# Patient Record
Sex: Female | Born: 1995
Health system: Southern US, Community
[De-identification: ages and names within clinical notes are randomized; demographics above are authoritative.]

## PROBLEM LIST (undated history)

## (undated) ENCOUNTER — Ambulatory Visit (HOSPITAL_COMMUNITY): Payer: MEDICAID

## (undated) HISTORY — PX: NO PAST SURGERIES: SHX2092

---
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
## 2016-09-13 DIAGNOSIS — L209 Atopic dermatitis, unspecified: Secondary | ICD-10-CM | POA: Insufficient documentation

## 2019-09-22 ENCOUNTER — Ambulatory Visit: Payer: Self-pay | Admitting: Family Medicine

## 2019-10-08 ENCOUNTER — Ambulatory Visit: Payer: Self-pay | Attending: Internal Medicine | Admitting: Internal Medicine

## 2019-10-08 ENCOUNTER — Encounter: Payer: Self-pay | Admitting: Internal Medicine

## 2019-10-08 ENCOUNTER — Ambulatory Visit: Payer: Self-pay

## 2019-10-08 ENCOUNTER — Other Ambulatory Visit: Payer: Self-pay

## 2019-10-08 VITALS — BP 117/74 | HR 82 | Temp 97.7°F | Resp 16 | Ht 62.0 in | Wt 123.4 lb

## 2019-10-08 DIAGNOSIS — Z113 Encounter for screening for infections with a predominantly sexual mode of transmission: Secondary | ICD-10-CM

## 2019-10-08 DIAGNOSIS — Z124 Encounter for screening for malignant neoplasm of cervix: Secondary | ICD-10-CM

## 2019-10-08 DIAGNOSIS — Z01419 Encounter for gynecological examination (general) (routine) without abnormal findings: Secondary | ICD-10-CM

## 2019-10-08 NOTE — Progress Notes (Signed)
Patient ID: Lori Ponce, female    DOB: April 02, 1996  MRN: [id number]  CC: New Patient (Initial Visit)   Subjective: Lori Ponce is a 24 y.o. female who presents for new pt visit and yearly physical Her concerns today include:   No previous PCP. Previous hx of dep and anxiety, genital herpes. Valcyclovir PRN Wants physical  G1P1. Last pap 2016 No abn Pap smears in the past No vaginal dischg/itching. Wants STD screen 1 same sex partner.   Menses are regular lasting 1 wk. No fhx of breast ovarian or cervical cancer  She feels previous history of depression and anxiety.  She was on several medications in the past but she does not recall the names.  GAD-7 and PHQ-9 screening are positive today.  However she feels that the depression and anxiety are not major issues for her.  No suicidal ideation.  She exercises 5 days a week going to the gym.  She has good family support and support from her partner.  She does not feel she needs any counseling or medication at this time.  Past medical, social, family history and surgical history reviewed and updated. No Known Allergies  Social History   Socioeconomic History  . Marital status: Single    Spouse name: Not on file  . Number of children: 1  . Years of education: 10 th grade  . Highest education level: Not on file  Occupational History  . Not on file  Tobacco Use  . Smoking status: Never Smoker  . Smokeless tobacco: Never Used  Substance and Sexual Activity  . Alcohol use: Yes    Comment: occasionally  . Drug use: Yes    Frequency: 2.0 times per week    Types: Marijuana  . Sexual activity: Yes    Birth control/protection: None  Other Topics Concern  . Not on file  Social History Narrative  . Not on file   Social Determinants of Health   Financial Resource Strain:   . Difficulty of Paying Living Expenses: Not on file  Food Insecurity:   . Worried About Programme researcher, broadcasting/film/video in the Last Year: Not on file  .  Ran Out of Food in the Last Year: Not on file  Transportation Needs:   . Lack of Transportation (Medical): Not on file  . Lack of Transportation (Non-Medical): Not on file  Physical Activity:   . Days of Exercise per Week: Not on file  . Minutes of Exercise per Session: Not on file  Stress:   . Feeling of Stress : Not on file  Social Connections:   . Frequency of Communication with Friends and Family: Not on file  . Frequency of Social Gatherings with Friends and Family: Not on file  . Attends Religious Services: Not on file  . Active Member of Clubs or Organizations: Not on file  . Attends Banker Meetings: Not on file  . Marital Status: Not on file  Intimate Partner Violence:   . Fear of Current or Ex-Partner: Not on file  . Emotionally Abused: Not on file  . Physically Abused: Not on file  . Sexually Abused: Not on file    Family History  Problem Relation Age of Onset  . Diabetes Maternal Grandmother   . Diabetes Maternal Grandfather     Past Surgical History:  Procedure Laterality Date  . NO PAST SURGERIES      ROS: Review of Systems  Constitutional: Negative for activity change, appetite change and fever.  HENT: Negative for dental problem, ear pain, hearing loss, sore throat and voice change.   Eyes: Negative for visual disturbance.       She was prescription lenses.  Last eye exam was about a year ago  Respiratory: Negative for chest tightness and shortness of breath.   Cardiovascular: Negative for chest pain.  Gastrointestinal: Negative for abdominal pain and blood in stool.  Genitourinary: Negative for difficulty urinating and dysuria.  Musculoskeletal: Negative for arthralgias.    PHYSICAL EXAM: BP 117/74   Pulse 82   Temp 97.7 F (36.5 C)   Resp 16   Ht 5\' 2"  (1.575 m)   Wt 123 lb 6.4 oz (56 kg)   LMP 09/13/2019   SpO2 98%   BMI 22.57 kg/m   Physical Exam Constitutional: Appears well-developed and well-nourished. No distress. Head:  Normocephalic. Atraumatic Ears: External right and left ear normal.   Eyes: Conjunctivae and EOM are normal. PERRLA, no scleral icterus.  Mouth: no oral lesions, good oral hygiene, throat clear without exudates Neck: Neck supple.  No tracheal deviation. No thyromegaly. No cervical LN CVS: RRR, S1/S2 +, no murmurs, no gallops, no carotid bruit. No JVD Pulmonary: Effort and breath sounds normal, no stridor, rhonchi, wheezes, rales.  Breasts: Exam done with CMA Pollock present.  No axillary lymphadenopathy.  Breasts are without palpable masses.  No discharge expressed from the nipples Abdominal: Soft. BS +,  no distension, tenderness, rebound or guarding. No organomegaly or masses Pelvic exam: CMA Pollock present -no external vaginal lesion.  Small amount of whitish discharge around the cervix.  Cervix appears grossly normal.  No cervical motion tenderness or adnexal masses.  Uterus felt normal in size. Musculoskeletal: Normal range of motion. No edema and no tenderness.  Neuro: Alert and oriented x3.  Cns grossly intact, Power: 5/5 BL in all 4s.  Reflexes normal.  Normal  muscle tone, coordination. . Skin: Skin is warm and dry. No rash noted. Not diaphoretic. No erythema. No pallor.  Psychiatric: Normal mood and affect. Behavior, judgment, thought content normal.    ASSESSMENT AND PLAN: 1. Well woman exam -Commended her on regular exercise and encouraged her to continue. Discussed and encourage healthy eating habits. Encourage wearing seatbelts when in the moving vehicle, using sunscreen when exposed to sun for more than 10 to 15 minutes -Patient declines flu vaccine and Tdap.  2. Pap smear for cervical cancer screening - Cytology - PAP  3. Screen for STD (sexually transmitted disease) - Cervicovaginal ancillary only - HIV Antibody (routine testing w rflx) - RPR   Patient was given the opportunity to ask questions.  Patient verbalized understanding of the plan and was able to repeat key  elements of the plan.   Orders Placed This Encounter  Procedures  . HIV Antibody (routine testing w rflx)  . RPR     Requested Prescriptions    No prescriptions requested or ordered in this encounter    Return if symptoms worsen or fail to improve.  Karle Plumber, MD, FACP

## 2019-10-08 NOTE — Patient Instructions (Signed)
Keeping You Healthy  Get These Tests 1. Blood Pressure- Have your blood pressure checked once a year by your health care provider.  Normal blood pressure is 120/80. 2. Weight- Have your body mass index (BMI) calculated to screen for obesity.  BMI is measure of body fat based on height and weight.  You can also calculate your own BMI at https://www.west-esparza.com/. 3. Cholesterol- Have your cholesterol checked every 5 years starting at age 25 then yearly starting at age 37. 4. Chlamydia, HIV, and other sexually transmitted diseases- Get screened every year until age 18, then within three months of each new sexual provider. 5. Pap Test - Every 1-5 years; discuss with your health care provider. 6. Mammogram- Every 1-2 years starting at age 25--50  Get these Immunizations  Tetanus shot- Every 10 years.  Flu shot-Every year.  Take these steps 1. Do not smoke-Your healthcare provider can help you quit.  For tips on how to quit go to www.smokefree.gov or call 1-800 QUITNOW. 2. Be physically active- Exercise 5 days a week for at least 30 minutes.  If you are not already physically active, start slow and gradually work up to 30 minutes of moderate physical activity.  Examples of moderate activity include walking briskly, dancing, swimming, bicycling, etc. 3. Breast Cancer- A self breast exam every month is important for early detection of breast cancer.  For more information and instruction on self breast exams, ask your healthcare provider or [place]. 4. Eat a healthy diet- Eat a variety of healthy foods such as fruits, vegetables, whole grains, low fat milk, low fat cheeses, yogurt, lean meats, poultry and fish, beans, nuts, tofu, etc.  For more information go to www. Thenutritionsource.org 5. Drink alcohol in moderation- Limit alcohol intake to one drink or less per day. Never drink and drive. 6. Depression- Your emotional health is as important as your physical  health.  If you're feeling down or losing interest in things you normally enjoy please talk to your healthcare provider about being screened for depression. 7. Dental visit- Brush and floss your teeth twice daily; visit your dentist twice a year. 8. Eye doctor- Get an eye exam at least every 2 years. 9. Helmet use- Always wear a helmet when riding a bicycle, motorcycle, rollerblading or skateboarding. 10. Safe sex- If you may be exposed to sexually transmitted infections, use a condom. 11. Seat belts- Seat belts can save your live; always wear one. 12. Smoke/Carbon Monoxide detectors- These detectors need to be installed on the appropriate level of your home. Replace batteries at least once a year. 13. Skin cancer- When out in the sun please cover up and use sunscreen 15 SPF or higher. 14. Violence- If anyone is threatening or hurting you, please tell your healthcare provider.

## 2019-10-09 LAB — RPR: RPR Ser Ql: NONREACTIVE

## 2019-10-09 LAB — HIV ANTIBODY (ROUTINE TESTING W REFLEX): HIV Screen 4th Generation wRfx: NONREACTIVE

## 2019-10-11 LAB — CYTOLOGY - PAP: Diagnosis: NEGATIVE

## 2019-10-11 LAB — CERVICOVAGINAL ANCILLARY ONLY
Bacterial Vaginitis (gardnerella): POSITIVE — AB
Candida Glabrata: NEGATIVE
Candida Vaginitis: NEGATIVE
Chlamydia: NEGATIVE
Comment: NEGATIVE
Comment: NEGATIVE
Comment: NEGATIVE
Comment: NEGATIVE
Comment: NEGATIVE
Comment: NORMAL
Neisseria Gonorrhea: NEGATIVE
Trichomonas: NEGATIVE

## 2019-10-12 ENCOUNTER — Telehealth: Payer: Self-pay

## 2019-10-12 NOTE — Telephone Encounter (Signed)
Contacted pt to go over lab/pap results pt is aware and doesn't have any questions or concerns

## 2019-10-13 ENCOUNTER — Other Ambulatory Visit: Payer: Self-pay | Admitting: Internal Medicine

## 2019-10-13 MED ORDER — METRONIDAZOLE 500 MG PO TABS: 500.0000 mg | ORAL_TABLET | Freq: Two times a day (BID) | ORAL | 0 refills | Status: DC

## 2019-10-13 MED FILL — metroNIDAZOLE 500 MG TABS: 500 | 7 days supply | Qty: 14 | Fill #0

## 2019-12-08 ENCOUNTER — Other Ambulatory Visit: Payer: Self-pay

## 2019-12-08 ENCOUNTER — Encounter (HOSPITAL_COMMUNITY): Payer: Self-pay

## 2019-12-08 ENCOUNTER — Ambulatory Visit (HOSPITAL_COMMUNITY)
Admission: EM | Admit: 2019-12-08 | Discharge: 2019-12-08 | Disposition: A | Discharge status: Home / Self Care | Payer: Self-pay | Attending: Emergency Medicine | Admitting: Emergency Medicine

## 2019-12-08 DIAGNOSIS — N368 Other specified disorders of urethra: Secondary | ICD-10-CM

## 2019-12-08 DIAGNOSIS — R42 Dizziness and giddiness: Secondary | ICD-10-CM

## 2019-12-08 NOTE — Discharge Instructions (Addendum)
Patient presented with drinkers present take Follow-up with OB/GYN Take OTC Dramamine for dizziness Return or go to ED for worsening of symptoms.

## 2019-12-08 NOTE — ED Provider Notes (Signed)
[facility]    CSN: [id number] Arrival date & time: 12/08/19  1252      History   Chief Complaint Chief Complaint  Patient presents with  . Abscess  . Dizziness    HPI Lori Ponce is a 24 y.o. female.   Who presented to the urgent care with a complaint of abscess on her labia.  Denies drainage but reports redness.  She states she waxed her private area.  He has not used any medication.  Nothing make her symptoms worse.  Denies chills, fever, nausea, vomiting, diarrhea, chest pain, chest tightness, confusion.  She is also complaining  of dizziness that started today. Reports she went out last night and had couple drinks.  Describes the dizziness as unsteady to walk." States that it is constant with episodes lasting few hours.  Has not tried any medication.   Admits to similar symptoms in the past that self resolved.  Denies fever, chills, nausea, vomiting, hearing changes, tinnitus, ear pain, chest pain, syncope, SOB, weakness, slurred speech, memory or emotional changes, facial drooping/ asymmetry, incoordination, numbness or tingling, abdominal pain, changes in bowel or bladder habits.    The history is provided by the patient. No language interpreter was used.  Abscess   History reviewed. No pertinent past medical history.  There are no problems to display for this patient.   Past Surgical History:  Procedure Laterality Date  . NO PAST SURGERIES      OB History   No obstetric history on file.      Home Medications    Prior to Admission medications   Medication Sig Start Date End Date Taking? Authorizing Provider  metroNIDAZOLE (FLAGYL) 500 MG tablet Take 1 tablet (500 mg total) by mouth 2 (two) times daily. 10/13/19   Ladell Pier, MD    Family History Family History  Problem Relation Age of Onset  . Diabetes Maternal Grandmother   . Diabetes Maternal Grandfather     Social History Social History   Tobacco Use  . Smoking status:  Never Smoker  . Smokeless tobacco: Never Used  Substance Use Topics  . Alcohol use: Yes    Comment: occasionally  . Drug use: Yes    Frequency: 2.0 times per week    Types: Marijuana     Allergies   Patient has no known allergies.   Review of Systems Review of Systems  Constitutional: Negative.   Respiratory: Negative.   Cardiovascular: Negative.   Gastrointestinal: Negative.   Skin: Positive for color change.  Neurological: Positive for dizziness.  All other systems reviewed and are negative.    Physical Exam Triage Vital Signs ED Triage Vitals  Enc Vitals Group     BP 12/08/19 1344 109/65     Pulse Rate 12/08/19 1344 64     Resp 12/08/19 1344 16     Temp 12/08/19 1344 98.2 F (36.8 C)     Temp Source 12/08/19 1344 Oral     SpO2 12/08/19 1344 100 %     Weight 12/08/19 1341 113 lb (51.3 kg)     Height --      Head Circumference --      Peak Flow --      Pain Score 12/08/19 1340 7     Pain Loc --      Pain Edu? --      Excl. in [place]? --    No data found.  Updated Vital Signs BP 109/65 (BP Location: Right Arm)  Pulse 64   Temp 98.2 F (36.8 C) (Oral)   Resp 16   Wt 113 lb (51.3 kg)   LMP 11/19/2019   SpO2 100%   BMI 20.67 kg/m   Visual Acuity Right Eye Distance:   Left Eye Distance:   Bilateral Distance:    Right Eye Near:   Left Eye Near:    Bilateral Near:     Physical Exam Vitals and nursing note reviewed. Exam conducted with a chaperone present.  Constitutional:      General: She is not in acute distress.    Appearance: Normal appearance. She is normal weight. She is not ill-appearing, toxic-appearing or diaphoretic.  Cardiovascular:     Rate and Rhythm: Normal rate and regular rhythm.     Pulses: Normal pulses.     Heart sounds: Normal heart sounds. No murmur. No friction rub. No gallop.   Pulmonary:     Effort: Pulmonary effort is normal. No respiratory distress.     Breath sounds: Normal breath sounds. No stridor. No wheezing,  rhonchi or rales.  Chest:     Chest wall: No tenderness.  Genitourinary:    Exam position: Knee-chest position.     Pubic Area: No rash.      Urethra: Prolapse present.       Comments: Ureteral prolapse present Neurological:     General: No focal deficit present.     Mental Status: She is alert and oriented to person, place, and time.     Cranial Nerves: Cranial nerves are intact.     Sensory: Sensation is intact.     Motor: Motor function is intact.     Coordination: Coordination is intact.     Gait: Gait is intact.     Deep Tendon Reflexes:     Reflex Scores:      Patellar reflexes are 2+ on the right side and 2+ on the left side.     UC Treatments / Results  Labs (all labs ordered are listed, but only abnormal results are displayed) Labs Reviewed - No data to display  EKG   Radiology No results found.  Procedures Procedures (including critical care time)  Medications Ordered in UC Medications - No data to display  Initial Impression / Assessment and Plan / UC Course  I have reviewed the triage vital signs and the nursing notes.  Pertinent labs & imaging results that were available during my care of the patient were reviewed by me and considered in my medical decision making (see chart for details).    Patient is stable for discharge.  EKG showed sinus bradycardia.  Was advised to follow-up with OB/GYN for further evaluation regarding urethral prolapse.  Patient went out last night and had a cup of drink.  Symptoms likely from dehydration.  May take OTC Dramamine for dizziness.  Final Clinical Impressions(s) / UC Diagnoses   Final diagnoses:  Urethral prolapse  Dizziness     Discharge Instructions     Patient presented with drinkers present take Follow-up with OB/GYN Take OTC Dramamine for dizziness Return or go to ED for worsening of symptoms.    ED Prescriptions    None     PDMP not reviewed this encounter.   Durward Parcel,  FNP 12/08/19 1529

## 2019-12-08 NOTE — ED Triage Notes (Signed)
Pt states she got waxed and now she has an abscess in her vaginal area. X 2 days pt states she has been dizzy as well.

## 2020-01-09 ENCOUNTER — Encounter (HOSPITAL_COMMUNITY): Payer: Self-pay | Admitting: Emergency Medicine

## 2020-01-09 ENCOUNTER — Emergency Department (HOSPITAL_COMMUNITY)
Admission: EM | Admit: 2020-01-09 | Discharge: 2020-01-09 | Disposition: A | Discharge status: Home / Self Care | Payer: Medicaid Other | Attending: Emergency Medicine | Admitting: Emergency Medicine

## 2020-01-09 ENCOUNTER — Other Ambulatory Visit: Payer: Self-pay

## 2020-01-09 DIAGNOSIS — Z5321 Procedure and treatment not carried out due to patient leaving prior to being seen by health care provider: Secondary | ICD-10-CM | POA: Insufficient documentation

## 2020-01-09 DIAGNOSIS — K0889 Other specified disorders of teeth and supporting structures: Secondary | ICD-10-CM | POA: Insufficient documentation

## 2020-01-09 NOTE — ED Triage Notes (Signed)
Right upper dental pain for a week, no fever no swollen.

## 2020-01-10 ENCOUNTER — Ambulatory Visit (HOSPITAL_COMMUNITY)
Admission: EM | Admit: 2020-01-10 | Discharge: 2020-01-10 | Disposition: A | Discharge status: Home / Self Care | Payer: Medicaid Other | Attending: Family Medicine | Admitting: Family Medicine

## 2020-01-10 ENCOUNTER — Encounter (HOSPITAL_COMMUNITY): Payer: Self-pay | Admitting: Emergency Medicine

## 2020-01-10 ENCOUNTER — Other Ambulatory Visit: Payer: Self-pay

## 2020-01-10 DIAGNOSIS — K0889 Other specified disorders of teeth and supporting structures: Secondary | ICD-10-CM

## 2020-01-10 DIAGNOSIS — K029 Dental caries, unspecified: Secondary | ICD-10-CM

## 2020-01-10 MED ORDER — KETOROLAC TROMETHAMINE 30 MG/ML IJ SOLN
INTRAMUSCULAR | Status: AC
  Filled 2020-01-10: qty 1

## 2020-01-10 MED ORDER — IBUPROFEN 600 MG PO TABS: 600.0000 mg | ORAL_TABLET | Freq: Three times a day (TID) | ORAL | 0 refills | Status: DC | PRN

## 2020-01-10 MED ORDER — CHLORHEXIDINE GLUCONATE 0.12 % MT SOLN
15.0000 mL | Freq: Two times a day (BID) | OROMUCOSAL | 0 refills | Status: DC
Start: 2020-01-10 — End: 2020-05-15

## 2020-01-10 MED ORDER — KETOROLAC TROMETHAMINE 30 MG/ML IJ SOLN
30.0000 mg | Freq: Once | INTRAMUSCULAR | Status: AC
  Administered 2020-01-10: 30 mg via INTRAMUSCULAR

## 2020-01-10 MED ORDER — PENICILLIN V POTASSIUM 500 MG PO TABS: 500.0000 mg | ORAL_TABLET | Freq: Four times a day (QID) | ORAL | 0 refills | Status: AC

## 2020-01-10 MED ORDER — ACETAMINOPHEN 500 MG PO TABS: 500.0000 mg | ORAL_TABLET | Freq: Four times a day (QID) | ORAL | 0 refills | Status: DC | PRN

## 2020-01-10 NOTE — Discharge Instructions (Addendum)
Treating you for dental infection with penicillin.  Take medication as prescribed. Peridex mouthwash to use twice a day to clean mouth Tylenol and ibuprofen for pain as needed Dental resources given

## 2020-01-10 NOTE — ED Provider Notes (Signed)
[facility]    CSN: [id number] Arrival date & time: 01/10/20  0808      History   Chief Complaint Chief Complaint  Patient presents with  . Dental Pain    HPI Lori Ponce is Ponce 24 y.o. female.   Patient is Ponce 24 year old female presents today for right upper dental pain that has been present, waxing waning for the past week.  Has been taking Aleve without much relief of her pain.  Denies any known dental issues in that area and patient does not currently have Ponce dentist.  No swelling, fever, chills, trismus.  ROS per HPI      History reviewed. No pertinent past medical history.  There are no problems to display for this patient.   Past Surgical History:  Procedure Laterality Date  . NO PAST SURGERIES      OB History   No obstetric history on file.      Home Medications    Prior to Admission medications   Medication Sig Start Date End Date Taking? Authorizing Provider  acetaminophen (TYLENOL) 500 MG tablet Take 1 tablet (500 mg total) by mouth every 6 (six) hours as needed. 01/10/20   Lori Halt A, NP  chlorhexidine (PERIDEX) 0.12 % solution Use as directed 15 mLs in the mouth or throat 2 (two) times daily. 01/10/20   Lori Halt A, NP  ibuprofen (ADVIL) 600 MG tablet Take 1 tablet (600 mg total) by mouth every 8 (eight) hours as needed for moderate pain. 01/10/20   Lori Halt A, NP  penicillin v potassium (VEETID) 500 MG tablet Take 1 tablet (500 mg total) by mouth 4 (four) times daily for 7 days. 01/10/20 01/17/20  Lori July, NP    Family History Family History  Problem Relation Age of Onset  . Diabetes Maternal Grandmother   . Diabetes Maternal Grandfather     Social History Social History   Tobacco Use  . Smoking status: Never Smoker  . Smokeless tobacco: Never Used  Substance Use Topics  . Alcohol use: Yes    Comment: occasionally  . Drug use: Yes    Frequency: 2.0 times per week    Types: Marijuana     Allergies   Patient  has no known allergies.   Review of Systems Review of Systems   Physical Exam Triage Vital Signs ED Triage Vitals  Enc Vitals Group     BP 01/10/20 0842 120/73     Pulse Rate 01/10/20 0842 64     Resp 01/10/20 0842 16     Temp 01/10/20 0842 98.1 F (36.7 C)     Temp Source 01/10/20 0842 Oral     SpO2 01/10/20 0842 100 %     Weight --      Height --      Head Circumference --      Peak Flow --      Pain Score 01/10/20 0849 10     Pain Loc --      Pain Edu? --      Excl. in [place]? --    No data found.  Updated Vital Signs BP 120/73 (BP Location: Left Arm)   Pulse 64   Temp 98.1 F (36.7 C) (Oral)   Resp 16   LMP 12/27/2019   SpO2 100%   Visual Acuity Right Eye Distance:   Left Eye Distance:   Bilateral Distance:    Right Eye Near:   Left Eye Near:    Bilateral  Near:     Physical Exam Vitals and nursing note reviewed.  Constitutional:      General: She is not in acute distress.    Appearance: Normal appearance. She is not ill-appearing, toxic-appearing or diaphoretic.  HENT:     Head: Normocephalic.     Nose: Nose normal.     Mouth/Throat:      Comments: Small hole in center of tooth No facial swelling or trismus Eyes:     Conjunctiva/sclera: Conjunctivae normal.  Pulmonary:     Effort: Pulmonary effort is normal.  Musculoskeletal:        General: Normal range of motion.     Cervical back: Normal range of motion.  Skin:    General: Skin is warm and dry.     Findings: No rash.  Neurological:     Mental Status: She is alert.  Psychiatric:        Mood and Affect: Mood normal.      UC Treatments / Results  Labs (all labs ordered are listed, but only abnormal results are displayed) Labs Reviewed - No data to display  EKG   Radiology No results found.  Procedures Procedures (including critical care time)  Medications Ordered in UC Medications  ketorolac (TORADOL) 30 MG/ML injection 30 mg (30 mg Intramuscular Given 01/10/20 0922)     Initial Impression / Assessment and Plan / UC Course  I have reviewed the triage vital signs and the nursing notes.  Pertinent labs & imaging results that were available during my care of the patient were reviewed by me and considered in my medical decision making (see chart for details).     Dental pain  Most likely dental caries causing hole  in tooth Most likely food is getting stuck in the hole and causing inflammation and could be underlying infection. We will give her mouthwash to clean the mouth out twice Ponce day after eating and prescribed penicillin for antibiotic coverage. Ibuprofen and Tylenol for pain Toradol given here for pain Dental resources given Final Clinical Impressions(s) / UC Diagnoses   Final diagnoses:  Pain, dental  Pain due to dental caries     Discharge Instructions     Treating you for dental infection with penicillin.  Take medication as prescribed. Peridex mouthwash to use twice Ponce day to clean mouth Tylenol and ibuprofen for pain as needed Dental resources given     ED Prescriptions    Medication Sig Dispense Auth. Provider   penicillin v potassium (VEETID) 500 MG tablet Take 1 tablet (500 mg total) by mouth 4 (four) times daily for 7 days. 40 tablet Lori Rendall A, NP   ibuprofen (ADVIL) 600 MG tablet Take 1 tablet (600 mg total) by mouth every 8 (eight) hours as needed for moderate pain. 30 tablet Lori Tabares A, NP   acetaminophen (TYLENOL) 500 MG tablet Take 1 tablet (500 mg total) by mouth every 6 (six) hours as needed. 30 tablet Lori Jares A, NP   chlorhexidine (PERIDEX) 0.12 % solution Use as directed 15 mLs in the mouth or throat 2 (two) times daily. 120 mL Lori Costanzo A, NP     PDMP not reviewed this encounter.   Lori Aris, NP 01/10/20 1154

## 2020-01-10 NOTE — ED Triage Notes (Signed)
Right, top tooth ache for one week. Patient does not have a Education officer, community.

## 2020-05-04 DIAGNOSIS — Z9189 Other specified personal risk factors, not elsewhere classified: Secondary | ICD-10-CM | POA: Insufficient documentation

## 2020-05-04 DIAGNOSIS — R4589 Other symptoms and signs involving emotional state: Secondary | ICD-10-CM | POA: Insufficient documentation

## 2020-05-04 DIAGNOSIS — IMO0002 Reserved for concepts with insufficient information to code with codable children: Secondary | ICD-10-CM | POA: Insufficient documentation

## 2020-05-04 HISTORY — DX: Other specified personal risk factors, not elsewhere classified: Z91.89

## 2020-05-04 HISTORY — DX: Other symptoms and signs involving emotional state: R45.89

## 2020-05-10 ENCOUNTER — Telehealth: Payer: Self-pay | Admitting: Licensed Clinical Social Worker

## 2020-05-10 NOTE — Telephone Encounter (Signed)
Call placed to patient. LCSW introduced self and explained role at [facility]. Pt reported that she is interested in establishing psychotherapy and medication management in the community. LCSW informed patient of [facility] [place] and encouraged her to call to schedule an appointment to establish care. Pt verbalized understanding.   LCSW completed Legal Aid referral to assist with medicaid appeal, per pt request. No additional concerns noted. Pt was reminded of upcoming appointment with Dr. Laural Benes scheduled for Monday, Oct 4, 21

## 2020-05-11 ENCOUNTER — Telehealth: Payer: Self-pay | Admitting: Licensed Clinical Social Worker

## 2020-05-11 NOTE — Telephone Encounter (Signed)
LCSW submitted completed Legal Aid referral 

## 2020-05-15 ENCOUNTER — Ambulatory Visit: Payer: Self-pay | Attending: Internal Medicine | Admitting: Licensed Clinical Social Worker

## 2020-05-15 ENCOUNTER — Other Ambulatory Visit: Payer: Self-pay | Admitting: Internal Medicine

## 2020-05-15 ENCOUNTER — Encounter: Payer: Self-pay | Admitting: Internal Medicine

## 2020-05-15 ENCOUNTER — Ambulatory Visit: Payer: Self-pay | Attending: Internal Medicine | Admitting: Internal Medicine

## 2020-05-15 ENCOUNTER — Other Ambulatory Visit: Payer: Self-pay

## 2020-05-15 VITALS — BP 107/69 | HR 75 | Resp 16 | Wt 111.0 lb

## 2020-05-15 DIAGNOSIS — F331 Major depressive disorder, recurrent, moderate: Secondary | ICD-10-CM

## 2020-05-15 DIAGNOSIS — F4310 Post-traumatic stress disorder, unspecified: Secondary | ICD-10-CM | POA: Insufficient documentation

## 2020-05-15 DIAGNOSIS — F419 Anxiety disorder, unspecified: Secondary | ICD-10-CM

## 2020-05-15 DIAGNOSIS — F411 Generalized anxiety disorder: Secondary | ICD-10-CM

## 2020-05-15 DIAGNOSIS — G4489 Other headache syndrome: Secondary | ICD-10-CM

## 2020-05-15 DIAGNOSIS — Z2821 Immunization not carried out because of patient refusal: Secondary | ICD-10-CM | POA: Insufficient documentation

## 2020-05-15 MED ORDER — HYDROXYZINE HCL 25 MG PO TABS: 25.0000 mg | ORAL_TABLET | Freq: Every day | ORAL | 1 refills | Status: DC

## 2020-05-15 MED ORDER — ESCITALOPRAM OXALATE 20 MG PO TABS: 20.0000 mg | ORAL_TABLET | Freq: Every day | ORAL | 0 refills | Status: DC

## 2020-05-15 MED FILL — ESCITALOPRAM 20 MG TABLET: 20 | 30 days supply | Qty: 30 | Fill #0

## 2020-05-15 MED FILL — hydrOXYzine HCL 25 MG TABS: 25 | 30 days supply | Qty: 30 | Fill #0

## 2020-05-15 NOTE — Patient Instructions (Signed)
Stop trazodone.  Start hydroxyzine 25 mg at bedtime instead.  This will help with sleep and anxiety. Decrease Lexapro to 10 mg daily for the next 2 weeks then increase it to 20 mg daily. If you have worsening depression or anxiety.  Please call us or be seen in the emergency room.

## 2020-05-15 NOTE — Progress Notes (Signed)
Patient ID: Lori Ponce, female    DOB: 1995-09-12  MRN: [id number]  CC: Hospitalization Follow-up   Subjective: Lori Ponce is a 24 y.o. female who presents for hospital follow-up.   Her concerns today include:  Patient with history of depression/anxiety, genital herpes  Patient hospitalized on psychiatric ward at [facility] in [facility] from 9/23-20 02/2020 with severe recurrent depression without psychotic features.  Patient was threatening suicide with intentions of cutting her wrist.  She had reported drinking heavily 3 months ago but not recently.  She was started on Lexapro 20 mg, trazodone 50 mg and was told to establish care with behavioral health specialist. She has spoken with our LCSW already and will be meeting with her today.  Today: Pt feels depression/SI getting better. No further SI thoughts.  Lives with her brother. Still very anxious Has appt with [facility] 06/07/20 and counselling starts in November. "I get headaches a lot since she started taking the medications."  Hx of migraines but has not had one in a long time and current headaches are different in character from her migraines..  Migraines usually frontal but current headaches are all over the head. Takes Ibuprofen or Tylenol with relief but HA comes again the following day. No N/V, occasionaly light sensitivity.    She vapes nicotine products.   No street drug use.  No ETOH use.   Work at [employer]   Current Outpatient Medications on File Prior to Visit  Medication Sig Dispense Refill  . escitalopram (LEXAPRO) 20 MG tablet Take by mouth.    . traZODone (DESYREL) 50 MG tablet Take by mouth.    Marland Kitchen acetaminophen (TYLENOL) 500 MG tablet Take 1 tablet (500 mg total) by mouth every 6 (six) hours as needed. (Patient not taking: Reported on 05/15/2020) 30 tablet 0  . chlorhexidine (PERIDEX) 0.12 % solution Use as directed 15 mLs in the mouth or throat 2 (two) times daily. (Patient not taking: Reported  on 05/15/2020) 120 mL 0  . ibuprofen (ADVIL) 600 MG tablet Take 1 tablet (600 mg total) by mouth every 8 (eight) hours as needed for moderate pain. (Patient not taking: Reported on 05/15/2020) 30 tablet 0   No current facility-administered medications on file prior to visit.    No Known Allergies  Social History   Socioeconomic History  . Marital status: Single    Spouse name: Not on file  . Number of children: 1  . Years of education: 10 th grade  . Highest education level: Not on file  Occupational History  . Not on file  Tobacco Use  . Smoking status: Never Smoker  . Smokeless tobacco: Never Used  Vaping Use  . Vaping Use: Never used  Substance and Sexual Activity  . Alcohol use: Yes    Comment: occasionally  . Drug use: Yes    Frequency: 2.0 times per week    Types: Marijuana  . Sexual activity: Yes    Birth control/protection: None  Other Topics Concern  . Not on file  Social History Narrative  . Not on file   Social Determinants of Health   Financial Resource Strain:   . Difficulty of Paying Living Expenses: Not on file  Food Insecurity:   . Worried About Charity fundraiser in the Last Year: Not on file  . Ran Out of Food in the Last Year: Not on file  Transportation Needs:   . Lack of Transportation (Medical): Not on file  .  Lack of Transportation (Non-Medical): Not on file  Physical Activity:   . Days of Exercise per Week: Not on file  . Minutes of Exercise per Session: Not on file  Stress:   . Feeling of Stress : Not on file  Social Connections:   . Frequency of Communication with Friends and Family: Not on file  . Frequency of Social Gatherings with Friends and Family: Not on file  . Attends Religious Services: Not on file  . Active Member of Clubs or Organizations: Not on file  . Attends Archivist Meetings: Not on file  . Marital Status: Not on file  Intimate Partner Violence:   . Fear of Current or Ex-Partner: Not on file  .  Emotionally Abused: Not on file  . Physically Abused: Not on file  . Sexually Abused: Not on file    Family History  Problem Relation Age of Onset  . Diabetes Maternal Grandmother   . Diabetes Maternal Grandfather     Past Surgical History:  Procedure Laterality Date  . NO PAST SURGERIES      ROS: Review of Systems Negative except as stated above  PHYSICAL EXAM: BP 107/69   Pulse 75   Resp 16   Wt 111 lb (50.3 kg)   SpO2 99%   BMI 20.30 kg/m   Wt Readings from Last 3 Encounters:  05/15/20 111 lb (50.3 kg)  01/09/20 113 lb (51.3 kg)  12/08/19 113 lb (51.3 kg)    Physical Exam  General appearance - alert, well appearing, young female and in no distress Mental status - normal mood, behavior, speech, dress, motor activity, and thought processes Neck - supple, no significant adenopathy.  She makes good eye contact. Chest - clear to auscultation, no wheezes, rales or rhonchi, symmetric air entry Heart - normal rate, regular rhythm, normal S1, S2, no murmurs, rubs, clicks or gallops Neurological - cranial nerves II through XII intact, motor and sensory grossly normal bilaterally, Romberg sign negative, normal gait and station Extremities - peripheral pulses normal, no pedal edema, no clubbing or cyanosis  Depression screen [facility] 2/9 05/15/2020 10/08/2019  Decreased Interest 2 2  Down, Depressed, Hopeless 2 1  PHQ - 2 Score 4 3  Altered sleeping 2 0  Tired, decreased energy 2 1  Change in appetite 3 3  Feeling bad or failure about yourself  2 1  Trouble concentrating 0 0  Moving slowly or fidgety/restless 0 0  Suicidal thoughts 2 0  PHQ-9 Score 15 8   GAD 7 : Generalized Anxiety Score 05/15/2020 10/08/2019  Nervous, Anxious, on Edge 3 2  Control/stop worrying 3 2  Worry too much - different things 3 1  Trouble relaxing 3 0  Restless 3 0  Easily annoyed or irritable 0 3  Afraid - awful might happen 0 3  Total GAD 7 Score 15 11    ASSESSMENT AND PLAN:  1.  Recurrent moderate major depressive disorder with anxiety [facility]) Patient reportedly is doing better since hospitalization. I suspect that her headaches may be due to trazodone or of the Lexapro as both can cause headaches.  Lexapro was started at 20 mg instead of 10.  She was given a 2-week supply of the 20 mg tablets and she has about 1 week left.  I recommend that she cuts the pill in half and takes 10 mg daily for the next 2 weeks then after that increase it back to 20 mg to see whether the headaches improve. -Also  recommend discontinuing trazodone.  We will use hydroxyzine instead -Keep appointment with behavioral health specialist later this month. Follow-up immediately or call us if she develops any worsening depression or suicidal thoughts. She met with our LCSW today. - escitalopram (LEXAPRO) 20 MG tablet; Take 1 tablet (20 mg total) by mouth daily.  Dispense: 30 tablet; Refill: 0  2. Anxiety disorder, unspecified type See plan above - hydrOXYzine (ATARAX/VISTARIL) 25 MG tablet; Take 1 tablet (25 mg total) by mouth at bedtime.  Dispense: 30 tablet; Refill: 1  3. Headache syndrome See plan above  4. Influenza vaccination declined This was offered and patient declined.  5. COVID-19 vaccination declined Strongly advised that she gets the COVID-19 vaccines to help protect herself in the community from getting Covid.  Informed her that the vaccines are relatively safe and effective.  Patient declined.    Patient was given the opportunity to ask questions.  Patient verbalized understanding of the plan and was able to repeat key elements of the plan.   No orders of the defined types were placed in this encounter.    Requested Prescriptions    No prescriptions requested or ordered in this encounter    No follow-ups on file.  Karle Plumber, MD, FACP

## 2020-06-05 NOTE — BH Specialist Note (Signed)
Integrated Behavioral Health Initial Visit  MRN: [id number] Name: Lori Ponce  Number of Integrated Behavioral Health Clinician visits:: 1/6 Session Start time: 10:00 AM  Session End time: 10:20 AM Total time: 20  Type of Service: Integrated Behavioral Health- Individual Interpretor:No. Interpretor Name and Language: NA   Warm Hand Off Completed.       SUBJECTIVE: Lori Ponce is a 24 y.o. female accompanied by self Patient was referred by Dr. Laural Benes for depression and anxiety. Patient reports the following symptoms/concerns: Pt reports difficulty managing depression and anxiety symptoms triggered by stress. Symptoms include fidgeting, overwhelming feelings of worry regarding finances, decreased appetite, and irritability Duration of problem: Ongoing; Severity of problem: severe  OBJECTIVE: Mood: Anxious and Affect: Appropriate Risk of harm to self or others: No plan to harm self or others Pt was recently admitted to [facility] due to suicidal ideations. Currently, pt scored positive on phq9; however, denies SI/HI. Protective factors identified and crisis intervention resources provided.  LIFE CONTEXT: Family and Social: Pt receives strong support from family and partner.  School/Work: Pt is employed. She is currently uninsured Self-Care: Pt is participating in medication mangaement. She has an appointment with BH scheduled for 06/07/20 with psychiatrist and a therapy appt in Nov 2021 Life Changes: Pt reports ongoing work stress that negatively impacts pt's ability to manage depression and anxiety symptoms  GOALS ADDRESSED: Patient will: 1. Reduce symptoms of: anxiety, depression and stress Pt agreed to continue with medication management to assist in decrease of symptoms 2. Increase knowledge and/or ability of: coping skills Pt agreed to utilize healthy coping skills discussed (going for a drive and running) 3. Demonstrate ability to: Increase adequate support systems for  patient/family Pt agreed to follow through with scheduled appointments with [facility]  INTERVENTIONS: Interventions utilized: Solution-Focused Strategies, Behavioral Activation and Supportive Counseling  Standardized Assessments completed: GAD-7 and PHQ 2&9 with C-SSRS  ASSESSMENT: Patient currently experiencing difficulty managing depression and anxiety symptoms triggered by stress. Pt was recently admitted to [facility] due to suicidal ideations. Currently, pt scored positive on phq9; however, denies SI/HI. Protective factors identified and crisis intervention resources provided.   Patient may benefit from therapy and continued medication management. Healthy coping skills were identified and patient has upcoming appointments with behavioral health.   PLAN: 1. Follow up with behavioral health clinician on : Contact LCSW with any additional behavioral health and/or resource needs 2. Behavioral recommendations: Utilize strategies discussed and continue to comply with medications 3. Referral(s): Integrated Behavioral Health Services (In Clinic) 4. "From scale of 1-10, how likely are you to follow plan?":   Bridgett Larsson, LCSW 06/05/2020 11:36 PM

## 2020-06-07 ENCOUNTER — Other Ambulatory Visit: Payer: Self-pay

## 2020-06-07 ENCOUNTER — Ambulatory Visit (INDEPENDENT_AMBULATORY_CARE_PROVIDER_SITE_OTHER): Payer: No Payment, Other | Admitting: Clinical

## 2020-06-07 DIAGNOSIS — F339 Major depressive disorder, recurrent, unspecified: Secondary | ICD-10-CM | POA: Insufficient documentation

## 2020-06-07 DIAGNOSIS — F332 Major depressive disorder, recurrent severe without psychotic features: Secondary | ICD-10-CM
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
## 2020-06-20 ENCOUNTER — Other Ambulatory Visit (HOSPITAL_COMMUNITY): Payer: Self-pay | Admitting: Psychiatry

## 2020-06-20 ENCOUNTER — Telehealth (INDEPENDENT_AMBULATORY_CARE_PROVIDER_SITE_OTHER): Payer: No Payment, Other | Admitting: Psychiatry

## 2020-06-20 ENCOUNTER — Encounter (HOSPITAL_COMMUNITY): Payer: Self-pay | Admitting: Psychiatry

## 2020-06-20 ENCOUNTER — Other Ambulatory Visit: Payer: Self-pay

## 2020-06-20 DIAGNOSIS — F331 Major depressive disorder, recurrent, moderate: Secondary | ICD-10-CM

## 2020-06-20 DIAGNOSIS — F419 Anxiety disorder, unspecified: Secondary | ICD-10-CM | POA: Diagnosis not present

## 2020-06-20 DIAGNOSIS — F339 Major depressive disorder, recurrent, unspecified: Secondary | ICD-10-CM

## 2020-06-20 MED ORDER — HYDROXYZINE HCL 10 MG PO TABS: 10.0000 mg | ORAL_TABLET | Freq: Three times a day (TID) | ORAL | 1 refills | Status: DC | PRN

## 2020-06-20 MED ORDER — ESCITALOPRAM OXALATE 20 MG PO TABS: 20.0000 mg | ORAL_TABLET | Freq: Every day | ORAL | 0 refills | Status: DC

## 2020-06-20 MED ORDER — HYDROXYZINE HCL 50 MG PO TABS: 50.0000 mg | ORAL_TABLET | Freq: Every day | ORAL | 1 refills | Status: DC

## 2020-06-20 MED FILL — hydrOXYzine HCL 10 MG TABS: 10 | 30 days supply | Qty: 90 | Fill #0

## 2020-06-20 MED FILL — hydrOXYzine HCL 50 MG TABS: 50 | 30 days supply | Qty: 30 | Fill #0

## 2020-06-20 MED FILL — ESCITALOPRAM 20 MG TABLET: 20 | 30 days supply | Qty: 30 | Fill #0

## 2020-06-20 NOTE — Progress Notes (Signed)
Psychiatric Initial Adult Assessment   Virtual Visit via Video Note  I connected with Lori Ponce on 06/20/20 at 11:00 AM EST by a video enabled telemedicine application and verified that I am speaking with the correct person using two identifiers.  Location: Patient: Home Provider: Clinic   I discussed the limitations of evaluation and management by telemedicine and the availability of in person appointments. The patient expressed understanding and agreed to proceed.  I provided 28 minutes of non-face-to-face time during this encounter.     Patient Identification: Lori Ponce MRN:  [id number] Date of Evaluation:  06/20/2020   Referral Source: Dr. Laural Benes  Chief Complaint:   " I still get really bad anxiety."  Visit Diagnosis:    ICD-10-CM   1. Major depressive disorder, recurrent episode with anxious distress (HCC)  F33.9     History of Present Illness: This is a 24 year old female with history of MDD and anxiety now seen for evaluation.  Patient reported that she was recently hospitalized in September for depression and anxiety and was discharged on Lexapro and hydroxyzine.  She stated that she was also prescribed trazodone however that caused her to have more headaches.  She stated that she recently ran out of Lexapro a few days ago and that the dose of hydroxyzine only helps her partially to fall asleep at night.  She reported that she still has frequent anxiety and panic attacks during the daytime.  She stated that she is currently working for [employer] and is in the process of transferring to a different location. She probably informed that she has been sober for 5 months.  She informed that prior to that she was drinking alcohol excessively and was also using marijuana. She denied any ongoing symptoms of depression like anhedonia, low energy levels, depressed mood, frequent crying spells.  She denies any recent suicidal ideations. She does have history of  prior suicide attempts by overdosing and also by cutting her wrists. She denied any symptoms of mania or hypomania. She denied any hallucinations or delusions. She denied any ongoing use of any illicit substances.  She stated that she found hydroxyzine to be helpful for sleep and partially for anxiety.  She was agreeable to adjusting the dose of hydroxyzine to 50 mg at bedtime for sleep and then using 10 mg dose for daytime breakthrough anxiety attacks.  She was recommended to take her dose of Lexapro daily for optimal effect. Patient was agreeable to this recommendation.  She informed that she lives with her wife and 2 children who are 4 and 10.  She informed that the 29-year-old is her own biological child and the 12 year old is her stepdaughter.  She stated that she wants to do well at work and be there for her family.  She has been seen by therapist Ms. Paige once, has an appointment scheduled in December.  Past Psychiatric History: MDD, 2 previous psychiatric admissions.  Most recent hospitalization 04/21/2020 for suicidal ideations.  Last hospitalization was in 2014 after a suicide attempt when she cut her wrist.  She has history of prior suicide attempts by overdosing as well.  Also has history of excessive alcohol consumption and marijuana use.  Previous Psychotropic Medications: Yes   Substance Abuse History in the last 12 months:  Yes.    Consequences of Substance Abuse: Family Consequences:  Not being honest with family  Past Medical History: No past medical history on file.  Past Surgical History:  Procedure Laterality Date  . NO PAST  SURGERIES      Family Psychiatric History: denied  Family History:  Family History  Problem Relation Age of Onset  . Diabetes Maternal Grandmother   . Diabetes Maternal Grandfather     Social History:   Social History   Socioeconomic History  . Marital status: Single    Spouse name: Not on file  . Number of children: 1  . Years of  education: 10 th grade  . Highest education level: Not on file  Occupational History  . Not on file  Tobacco Use  . Smoking status: Never Smoker  . Smokeless tobacco: Never Used  Vaping Use  . Vaping Use: Never used  Substance and Sexual Activity  . Alcohol use: Yes    Comment: occasionally  . Drug use: Yes    Frequency: 2.0 times per week    Types: Marijuana  . Sexual activity: Yes    Birth control/protection: None  Other Topics Concern  . Not on file  Social History Narrative  . Not on file   Social Determinants of Health   Financial Resource Strain:   . Difficulty of Paying Living Expenses: Not on file  Food Insecurity:   . Worried About Programme researcher, broadcasting/film/video in the Last Year: Not on file  . Ran Out of Food in the Last Year: Not on file  Transportation Needs:   . Lack of Transportation (Medical): Not on file  . Lack of Transportation (Non-Medical): Not on file  Physical Activity:   . Days of Exercise per Week: Not on file  . Minutes of Exercise per Session: Not on file  Stress:   . Feeling of Stress : Not on file  Social Connections:   . Frequency of Communication with Friends and Family: Not on file  . Frequency of Social Gatherings with Friends and Family: Not on file  . Attends Religious Services: Not on file  . Active Member of Clubs or Organizations: Not on file  . Attends Banker Meetings: Not on file  . Marital Status: Not on file    Additional Social History: see HPI  Allergies:  No Known Allergies  Metabolic Disorder Labs: No results found for: HGBA1C, MPG No results found for: PROLACTIN No results found for: CHOL, TRIG, HDL, CHOLHDL, VLDL, LDLCALC No results found for: TSH  Therapeutic Level Labs: No results found for: LITHIUM No results found for: CBMZ No results found for: VALPROATE  Current Medications: Current Outpatient Medications  Medication Sig Dispense Refill  . acetaminophen (TYLENOL) 500 MG tablet Take 1 tablet (500 mg  total) by mouth every 6 (six) hours as needed. (Patient not taking: Reported on 05/15/2020) 30 tablet 0  . escitalopram (LEXAPRO) 20 MG tablet Take 1 tablet (20 mg total) by mouth daily. 30 tablet 0  . hydrOXYzine (ATARAX/VISTARIL) 25 MG tablet Take 1 tablet (25 mg total) by mouth at bedtime. 30 tablet 1  . ibuprofen (ADVIL) 600 MG tablet Take 1 tablet (600 mg total) by mouth every 8 (eight) hours as needed for moderate pain. (Patient not taking: Reported on 05/15/2020) 30 tablet 0   No current facility-administered medications for this visit.     Psychiatric Specialty Exam: Review of Systems  There were no vitals taken for this visit.There is no height or weight on file to calculate BMI.  General Appearance: Fairly Groomed  Eye Contact:  Good  Speech:  Clear and Coherent and Normal Rate  Volume:  Normal  Mood:  Euthymic  Affect:  Congruent  Thought Process:  Goal Directed and Descriptions of Associations: Intact  Orientation:  Full (Time, Place, and Person)  Thought Content:  Logical  Suicidal Thoughts:  No  Homicidal Thoughts:  No  Memory:  Immediate;   Good Recent;   Good  Judgement:  Fair  Insight:  Fair  Psychomotor Activity:  Normal  Concentration:  Concentration: Good and Attention Span: Good  Recall:  Good  Fund of Knowledge:Good  Language: Good  Akathisia:  Negative  Handed:  Right  AIMS (if indicated):  0  Assets:  Communication Skills Desire for Improvement Financial Resources/Insurance Housing Social Support  ADL's:  Intact  Cognition: WNL  Sleep:  Fair   Screenings: GAD-7     Counselor from 06/07/2020 in [facility] Office Visit from 05/15/2020 in [facility] Office Visit from 10/08/2019 in [facility]  Total GAD-7 Score 18 15 11     PHQ2-9     Counselor from 06/07/2020 in [facility] Office Visit from 05/15/2020 in [facility] Office Visit from 10/08/2019 in [facility]  PHQ-2 Total Score 4 4 3   PHQ-9 Total Score 19 15 8       Assessment and Plan: Patient recently discharged from [facility] health psychiatry unit following admission for suicidal ideations.  She was started on Lexapro which she ran out recently.  She found Lexapro to be helpful.  She is also been prescribed hydroxyzine to help with sleep however she continues to report daytime anxiety attacks.  She is agreeable to trying low-dose hydroxyzine for as needed use for anxiety.  1. Major depressive disorder, recurrent episode with anxious distress (HCC)  -Continue escitalopram (LEXAPRO) 20 MG tablet; Take 1 tablet (20 mg total) by mouth daily.  Dispense: 30 tablet; Refill: 0 -Start hydrOXYzine (ATARAX/VISTARIL) 10 MG tablet; Take 1 tablet (10 mg total) by mouth 3 (three) times daily as needed for anxiety.  Dispense: 90 tablet; Refill: 1 -Increase hydrOXYzine (ATARAX/VISTARIL) 50 MG tablet; Take 1 tablet (50 mg total) by mouth at bedtime.  Dispense: 30 tablet; Refill: 1  Continue therapy with Ms. . Follow-up in 6 weeks.   , MD 11/9/202111:34 AM

## 2020-06-21 ENCOUNTER — Ambulatory Visit (HOSPITAL_COMMUNITY): Payer: No Payment, Other | Admitting: Psychiatry

## 2020-07-17 ENCOUNTER — Ambulatory Visit: Payer: Medicaid Other | Attending: Internal Medicine | Admitting: Internal Medicine

## 2020-07-17 ENCOUNTER — Other Ambulatory Visit: Payer: Self-pay | Admitting: Internal Medicine

## 2020-07-17 ENCOUNTER — Other Ambulatory Visit: Payer: Self-pay

## 2020-07-17 ENCOUNTER — Encounter: Payer: Self-pay | Admitting: Internal Medicine

## 2020-07-17 VITALS — BP 114/69 | HR 66 | Temp 98.0°F | Resp 16 | Wt 114.6 lb

## 2020-07-17 DIAGNOSIS — F339 Major depressive disorder, recurrent, unspecified: Secondary | ICD-10-CM

## 2020-07-17 DIAGNOSIS — F411 Generalized anxiety disorder: Secondary | ICD-10-CM

## 2020-07-17 MED ORDER — HYDROXYZINE HCL 50 MG PO TABS: 50.0000 mg | ORAL_TABLET | Freq: Every day | ORAL | 3 refills | Status: DC

## 2020-07-17 MED ORDER — ESCITALOPRAM OXALATE 20 MG PO TABS: 20.0000 mg | ORAL_TABLET | Freq: Every day | ORAL | 3 refills | Status: DC

## 2020-07-17 MED FILL — hydrOXYzine HCL 10 MG TABS: 10 | 30 days supply | Qty: 90 | Fill #1

## 2020-07-17 MED FILL — ESCITALOPRAM 20 MG TABLET: 20 | 30 days supply | Qty: 30 | Fill #0

## 2020-07-17 MED FILL — hydrOXYzine HCL 50 MG TABS: 50 | 30 days supply | Qty: 30 | Fill #0

## 2020-07-17 NOTE — Patient Instructions (Signed)
Keep your appointments with the therapist and psychiatrist.

## 2020-07-17 NOTE — Progress Notes (Signed)
Patient ID: Lori Ponce, female    DOB: 06-Jan-1996  MRN: [id number]  CC: Follow-up (6 week )   Subjective: Lori Ponce is a 24 y.o. female who presents for follow-up depression and anxiety Her concerns today include:  Patient with history of depression/anxiety, genital herpes, migraines  Patient seen about 2 months ago with depression and anxiety post hospitalization at psychiatric ward at [facility].  She was discharged on Lexapro and trazodone.  However I discontinued trazodone because she was having headaches which I felt was secondary to the medication.  We placed on hydroxyzine instead.  Today patient reports that she is doing much better.  She is taking the Lexapro and hydroxyzine and feels that the medications are working.  She has also seen the psychiatrist and counselor once since last visit with me.  She has a follow-up appointment coming up with the counselor again later this month.  She denies any suicidal ideation at this time.  Her PHQ-9 has improved from 15 now to 8 and GAD screen from 15 to 11.  Denies any suicidal ideation.  She reports that her headaches resolved when the trazodone was discontinued.. Patient Active Problem List   Diagnosis Date Noted  . Major depressive disorder, recurrent episode, severe with anxious distress (HCC) 06/07/2020  . Recurrent moderate major depressive disorder with anxiety (HCC) 05/15/2020  . Anxiety disorder 05/15/2020  . Influenza vaccination declined 05/15/2020  . COVID-19 vaccination declined 05/15/2020     Current Outpatient Medications on File Prior to Visit  Medication Sig Dispense Refill  . hydrOXYzine (ATARAX/VISTARIL) 10 MG tablet Take 1 tablet (10 mg total) by mouth 3 (three) times daily as needed for anxiety. 90 tablet 1   No current facility-administered medications on file prior to visit.    No Known Allergies  Social History   Socioeconomic History  . Marital status: Single    Spouse name: Not on file  .  Number of children: 1  . Years of education: 10 th grade  . Highest education level: Not on file  Occupational History  . Not on file  Tobacco Use  . Smoking status: Never Smoker  . Smokeless tobacco: Never Used  Vaping Use  . Vaping Use: Never used  Substance and Sexual Activity  . Alcohol use: Yes    Comment: occasionally  . Drug use: Yes    Frequency: 2.0 times per week    Types: Marijuana  . Sexual activity: Yes    Birth control/protection: None  Other Topics Concern  . Not on file  Social History Narrative  . Not on file   Social Determinants of Health   Financial Resource Strain:   . Difficulty of Paying Living Expenses: Not on file  Food Insecurity:   . Worried About Programme researcher, broadcasting/film/video in the Last Year: Not on file  . Ran Out of Food in the Last Year: Not on file  Transportation Needs:   . Lack of Transportation (Medical): Not on file  . Lack of Transportation (Non-Medical): Not on file  Physical Activity:   . Days of Exercise per Week: Not on file  . Minutes of Exercise per Session: Not on file  Stress:   . Feeling of Stress : Not on file  Social Connections:   . Frequency of Communication with Friends and Family: Not on file  . Frequency of Social Gatherings with Friends and Family: Not on file  . Attends Religious Services: Not on file  . Active Member of  Clubs or Organizations: Not on file  . Attends Banker Meetings: Not on file  . Marital Status: Not on file  Intimate Partner Violence:   . Fear of Current or Ex-Partner: Not on file  . Emotionally Abused: Not on file  . Physically Abused: Not on file  . Sexually Abused: Not on file    Family History  Problem Relation Age of Onset  . Diabetes Maternal Grandmother   . Diabetes Maternal Grandfather     Past Surgical History:  Procedure Laterality Date  . NO PAST SURGERIES      ROS: Review of Systems Negative except as stated above  PHYSICAL EXAM: BP 114/69   Pulse 66   Temp  98 F (36.7 C)   Resp 16   Wt 114 lb 9.6 oz (52 kg)   SpO2 97%   BMI 20.96 kg/m   Physical Exam  General appearance - alert, well appearing, young female and in no distress Mental status - normal mood, behavior, speech, dress, motor activity, and thought processes Chest - clear to auscultation, no wheezes, rales or rhonchi, symmetric air entry Heart - normal rate, regular rhythm, normal S1, S2, no murmurs, rubs, clicks or gallops Extremities - peripheral pulses normal, no pedal edema, no clubbing or cyanosis   ASSESSMENT AND PLAN: 1. Major depressive disorder, recurrent episode with anxious distress (HCC) 2. GAD (generalized anxiety disorder) -Improved and doing well on medications.  She will continue the Lexapro and hydroxyzine and keep f/u appt with her [facility] team. - escitalopram (LEXAPRO) 20 MG tablet; Take 1 tablet (20 mg total) by mouth daily.  Dispense: 30 tablet; Refill: 3 - hydrOXYzine (ATARAX/VISTARIL) 50 MG tablet; Take 1 tablet (50 mg total) by mouth at bedtime.  Dispense: 30 tablet; Refill: 3  Patient was given the opportunity to ask questions.  Patient verbalized understanding of the plan and was able to repeat key elements of the plan.   No orders of the defined types were placed in this encounter.    Requested Prescriptions   Signed Prescriptions Disp Refills  . escitalopram (LEXAPRO) 20 MG tablet 30 tablet 3    Sig: Take 1 tablet (20 mg total) by mouth daily.  . hydrOXYzine (ATARAX/VISTARIL) 50 MG tablet 30 tablet 3    Sig: Take 1 tablet (50 mg total) by mouth at bedtime.    Return in about 5 months (around 12/15/2020).  Lori Blue, MD, FACP

## 2020-07-20 ENCOUNTER — Ambulatory Visit (HOSPITAL_COMMUNITY): Payer: No Payment, Other | Admitting: Clinical

## 2020-08-01 ENCOUNTER — Other Ambulatory Visit: Payer: Self-pay

## 2020-08-01 ENCOUNTER — Encounter (HOSPITAL_COMMUNITY): Payer: Self-pay | Admitting: Psychiatry

## 2020-08-01 ENCOUNTER — Telehealth (INDEPENDENT_AMBULATORY_CARE_PROVIDER_SITE_OTHER): Payer: No Payment, Other | Admitting: Psychiatry

## 2020-08-01 DIAGNOSIS — F339 Major depressive disorder, recurrent, unspecified: Secondary | ICD-10-CM

## 2020-08-01 NOTE — Progress Notes (Signed)
BH OP Progress Note   Virtual Visit via Telephone Note  I connected with [name] on 08/01/20 at 11:20 AM EST by telephone and verified that I am speaking with the correct person using two identifiers.  Location: Patient: home Provider: Clinic   I discussed the limitations, risks, security and privacy concerns of performing an evaluation and management service by telephone and the availability of in person appointments. I also discussed with the patient that there may be a patient responsible charge related to this service. The patient expressed understanding and agreed to proceed.  Patient was contacted via telephone as the camera would not connect for the virtual visit. I provided 17 minutes of non-face-to-face time during this encounter.    Patient Identification: Lori Ponce MRN:  [id number] Date of Evaluation:  08/01/2020    Chief Complaint:   " I have been doing great but for some reason I have been having bad dreams for the past night that disturb my sleep."  Visit Diagnosis:    ICD-10-CM   1. Major depressive disorder, recurrent episode with anxious distress (HCC)  F33.9     History of Present Illness: Patient reported that she has done really well overall.  She stated that her mood has been much improved and she is not feeling depressed anymore.  She also reported improvement in her anxiety after hydroxyzine was added.  She stated that things are going well in her personal and professional life.  However she reported that for some reason she is started having frequent bad dreams at night in the last week.  She stated that those dreams wake her up from sleep and she is unable to go back to sleep. Writer asked if she had any new stressors, she replied maybe she is a little stressed because of the upcoming holidays.  Other than that she denied any other significant events that have happened recently. She stated that she missed her appointment with the therapist a  few weeks ago because of her family emergency.  She is going to contact the clinic to reschedule an appointment for follow-up. Writer recommended that we can just monitor the dreams for now and see if they improve on their own, patient was agreeable to that. She was recommended to continue the same regimen for now.    Past Psychiatric History: MDD, 2 previous psychiatric admissions.  Most recent hospitalization 04/21/2020 for suicidal ideations.  Last hospitalization was in 2014 after a suicide attempt when she cut her wrist.  She has history of prior suicide attempts by overdosing as well.  Also has history of excessive alcohol consumption and marijuana use.  Previous Psychotropic Medications: Yes , Trazodone- caused her to have frequent headaches  Substance Abuse History in the last 12 months:  Yes.    Consequences of Substance Abuse: Family Consequences:  Not being honest with family  Past Medical History: No past medical history on file.  Past Surgical History:  Procedure Laterality Date  . NO PAST SURGERIES      Family Psychiatric History: denied  Family History:  Family History  Problem Relation Age of Onset  . Diabetes Maternal Grandmother   . Diabetes Maternal Grandfather     Social History:   Social History   Socioeconomic History  . Marital status: Single    Spouse name: Not on file  . Number of children: 1  . Years of education: 10 th grade  . Highest education level: Not on file  Occupational History  . Not  on file  Tobacco Use  . Smoking status: Never Smoker  . Smokeless tobacco: Never Used  Vaping Use  . Vaping Use: Never used  Substance and Sexual Activity  . Alcohol use: Yes    Comment: occasionally  . Drug use: Yes    Frequency: 2.0 times per week    Types: Marijuana  . Sexual activity: Yes    Birth control/protection: None  Other Topics Concern  . Not on file  Social History Narrative  . Not on file   Social Determinants of Health    Financial Resource Strain: Not on file  Food Insecurity: Not on file  Transportation Needs: Not on file  Physical Activity: Not on file  Stress: Not on file  Social Connections: Not on file     Allergies:  No Known Allergies  Metabolic Disorder Labs: No results found for: HGBA1C, MPG No results found for: PROLACTIN No results found for: CHOL, TRIG, HDL, CHOLHDL, VLDL, LDLCALC No results found for: TSH  Therapeutic Level Labs: No results found for: LITHIUM No results found for: CBMZ No results found for: VALPROATE  Current Medications: Current Outpatient Medications  Medication Sig Dispense Refill  . escitalopram (LEXAPRO) 20 MG tablet Take 1 tablet (20 mg total) by mouth daily. 30 tablet 3  . hydrOXYzine (ATARAX/VISTARIL) 10 MG tablet Take 1 tablet (10 mg total) by mouth 3 (three) times daily as needed for anxiety. 90 tablet 1  . hydrOXYzine (ATARAX/VISTARIL) 50 MG tablet Take 1 tablet (50 mg total) by mouth at bedtime. 30 tablet 3   No current facility-administered medications for this visit.     Psychiatric Specialty Exam: Review of Systems  There were no vitals taken for this visit.There is no height or weight on file to calculate BMI.  General Appearance: Unable to assess due to phone visit  Eye Contact:  Unable to assess due to phone visit  Speech:  Clear and Coherent and Normal Rate  Volume:  Normal  Mood:  Euthymic  Affect:  Congruent  Thought Process:  Goal Directed and Descriptions of Associations: Intact  Orientation:  Full (Time, Place, and Person)  Thought Content:  Logical  Suicidal Thoughts:  No  Homicidal Thoughts:  No  Memory:  Immediate;   Good Recent;   Good  Judgement:  Fair  Insight:  Fair  Psychomotor Activity:  Normal  Concentration:  Concentration: Good and Attention Span: Good  Recall:  Good  Fund of Knowledge:Good  Language: Good  Akathisia:  Negative  Handed:  Right  AIMS (if indicated):  0  Assets:  Communication Skills Desire  for Improvement Financial Resources/Insurance Housing Social Support  ADL's:  Intact  Cognition: WNL  Sleep:  Fair   Screenings: GAD-7   Garment/textile technologist Visit from 07/17/2020 in [facility] Counselor from 06/07/2020 in [facility] Office Visit from 05/15/2020 in [facility] Office Visit from 10/08/2019 in [facility]  Total GAD-7 Score 11 18 15 11     PHQ2-9   Flowsheet Row Office Visit from 07/17/2020 in [facility] Counselor from 06/07/2020 in [facility] Office Visit from 05/15/2020 in [facility] Office Visit from 10/08/2019 in [facility]  PHQ-2 Total Score 2 4 4 3   PHQ-9 Total Score 8 19 15 8       Assessment and Plan: Patient appears to be doing  fairly well.  She did report increased bad dreams in the last week that it disturbs her sleep however other than that she has been doing well.  We will not change or adjust any medications at present and will monitor her sleep.   1. Major depressive disorder, recurrent episode with anxious distress (HCC)  -Continue escitalopram (LEXAPRO) 20 MG tablet; Take 1 tablet (20 mg total) by mouth daily.  Dispense: 30 tablet; Refill: 0 -hydrOXYzine (ATARAX/VISTARIL) 10 MG tablet; Take 1 tablet (10 mg total) by mouth 3 (three) times daily as needed for anxiety.  Dispense: 90 tablet; Refill: 1 -hydrOXYzine (ATARAX/VISTARIL) 50 MG tablet; Take 1 tablet (50 mg total) by mouth at bedtime.  Dispense: 30 tablet; Refill: 1  Continue same regimen. Continue therapy with Ms. Idalia Needle. Appt to be rescheduled. Follow-up in 2 months.   Zena Amos, MD 12/21/202111:25 AM

## 2020-08-16 ENCOUNTER — Other Ambulatory Visit: Payer: Self-pay

## 2020-08-16 ENCOUNTER — Ambulatory Visit (INDEPENDENT_AMBULATORY_CARE_PROVIDER_SITE_OTHER): Payer: No Payment, Other | Admitting: Clinical

## 2020-08-16 DIAGNOSIS — F339 Major depressive disorder, recurrent, unspecified: Secondary | ICD-10-CM

## 2020-08-16 NOTE — Progress Notes (Signed)
   THERAPIST PROGRESS NOTE  Session Time: 45 minutes  Participation Level: Active  Behavioral Response: CasualAlertEuthymic  Type of Therapy: Individual Therapy  Treatment Goals addressed: Diagnosis: depression  Interventions: CBT  Summary:  Lori Ponce is a 25 y.o. female who presents for the scheduled session oriented times five, appropriately dressed, and friendly. Client denied hallucinations and delusions. Client reported on today doing well. Client reported since the last session her psychiatry appointments have been going well and she is doing well on her medications. Client reported she has also changed job sites and now working for [employer] in [employer] and about to begin work at [employer]. Client reported during the holidays she began talking to her ex girlfriend again and is excited about that. Client reported close to thanksgiving is the anniversary of her best friends passing. Client reported her ex girlfriends grandmother passed around that time and she spent time with her ex girlfriend mourning the passing of her loved ones instead this year. Client reported their relationship is significant to her because she asked her to be her daughters "second mom" on mothers day. Client reported their relationship has been difficult to sustain because her ex girlfriend is hard to communicate with and is quick to anger. Client reported she wants to change that and work on how she talks as well.     Suicidal/Homicidal: Nowithout intent/plan  Therapist Response:  Therapist began the session checking in and asking the client how she has been doing since last seen. Therapist actively listened to the clients thoughts and feelings. Therapist engaged with the client to discuss in detail what is maintaining the current stressors identified. Therapist used CBT to discuss impulse control by thinking of observing the changes as her emotions as a third person internally before  reacting. Therapist assigned the client homework to use her existing interest of writing as a way to write and organize her thoughts on paper that she wants to address with her partner.  Plan: Return again in 7 weeks for individual therapy.  Diagnosis: Major depressive disorder, recurrent episode, with anxious distress  Neena Rhymes Udell Blasingame, LCSW 08/16/2020

## 2020-08-25 ENCOUNTER — Telehealth: Payer: Self-pay

## 2020-08-25 DIAGNOSIS — Z20822 Contact with and (suspected) exposure to covid-19: Secondary | ICD-10-CM

## 2020-08-25 NOTE — Telephone Encounter (Signed)
Pt contacted the office and states yesterday she woke up with a sore throat and a little cough. Pt states this morning around 4am she had a migraine and felt nauseated. Pt denies having diarrhea. Pt denies having lost of taste and smell   Spoke with Dr. Laural Benes and She gave the okay to give pt a COVID Test.  Reached out to pt to see when she can come by to get test pt states she cane be here by 430pm.

## 2020-08-27 ENCOUNTER — Encounter: Payer: Self-pay | Admitting: Internal Medicine

## 2020-08-27 LAB — SARS-COV-2, NAA 2 DAY TAT

## 2020-08-27 LAB — NOVEL CORONAVIRUS, NAA: SARS-CoV-2, NAA: DETECTED — AB

## 2020-08-28 ENCOUNTER — Ambulatory Visit: Payer: Medicaid Other | Attending: Internal Medicine | Admitting: Internal Medicine

## 2020-08-28 ENCOUNTER — Other Ambulatory Visit: Payer: Self-pay

## 2020-08-28 DIAGNOSIS — U071 COVID-19: Secondary | ICD-10-CM | POA: Insufficient documentation

## 2020-08-28 NOTE — Progress Notes (Signed)
Virtual Visit via Telephone Note  I connected with Lori Ponce on 08/28/20 at 10:27 a.m by telephone and verified that I am speaking with the correct person using two identifiers.  Location: Patient: home Provider: office The pt, my CMA Julius Bowels and myself participayed I discussed the limitations, risks, security and privacy concerns of performing an evaluation and management service by telephone and the availability of in person appointments. I also discussed with the patient that there may be a patient responsible charge related to this service. The patient expressed understanding and agreed to proceed.   History of Present Illness: Patient with history of depression/anxiety, genital herpes, migraines  Pt patient reports that 5 days ago she woke up with a sore throat with a little cough.  She had called the office.  We had her come as a nurse only visit for COVID testing.  I received her results this morning.  She tested positive.  Patient has received 2 shots of the COVID-19 Moderna vaccine but has not received her booster shot as yet.  Today she reports that she has a wet cough and a little headache.  She denies any fever, loss of taste/smell, nasal congestion or chest congestion.  She denies any shortness of breath.  She lives with her dad and stepmom both of whom have had no symptoms.  She works in [employer].  She has to turn in a copy of her positive test to her boss.  She plans to printed out from her MyChart account. Outpatient Encounter Medications as of 08/28/2020  Medication Sig  . escitalopram (LEXAPRO) 20 MG tablet Take 1 tablet (20 mg total) by mouth daily.  . hydrOXYzine (ATARAX/VISTARIL) 10 MG tablet Take 1 tablet (10 mg total) by mouth 3 (three) times daily as needed for anxiety.  . hydrOXYzine (ATARAX/VISTARIL) 50 MG tablet Take 1 tablet (50 mg total) by mouth at bedtime.   No facility-administered encounter medications on file as of 08/28/2020.       Observations/Objective: Results for orders placed or performed in visit on 08/25/20  Novel Coronavirus, NAA ([facility])   Specimen: Nasopharyngeal(NP) swabs in vial transport medium  Result Value Ref Range   SARS-CoV-2, NAA Detected (A) Not Detected  SARS-COV-2, NAA 2 DAY TAT  Result Value Ref Range   SARS-CoV-2, NAA 2 DAY TAT Performed      Assessment and Plan: 1. COVID-19 virus infection -Patient with very mild symptoms.  Does not need monoclonal antibody infusion or oral medication.  I have recommended quarantine for 5 to 7 days from the time of symptoms onset.  She would be able to break quarantine on Wednesday of this week provided her symptoms continue to improve.  Advised to continue wearing her mask when out in public and practice social distancing.  Encouraged her to get the COVID-19 booster about a month out from now. Patient did not have any questions for me.  Follow Up Instructions: As needed.   I discussed the assessment and treatment plan with the patient. The patient was provided an opportunity to ask questions and all were answered. The patient agreed with the plan and demonstrated an understanding of the instructions.   The patient was advised to call back or seek an in-person evaluation if the symptoms worsen or if the condition fails to improve as anticipated.  I provided 8 minutes of non-face-to-face time during this encounter.   Jonah Blue, MD

## 2020-08-28 NOTE — Progress Notes (Signed)
Pt states she is feeling fine   Pt states she is congested and has a cough

## 2020-09-28 ENCOUNTER — Other Ambulatory Visit: Payer: Self-pay

## 2020-09-28 ENCOUNTER — Encounter (HOSPITAL_COMMUNITY): Payer: Self-pay | Admitting: Psychiatry

## 2020-09-28 ENCOUNTER — Telehealth (INDEPENDENT_AMBULATORY_CARE_PROVIDER_SITE_OTHER): Payer: No Payment, Other | Admitting: Psychiatry

## 2020-09-28 ENCOUNTER — Other Ambulatory Visit (HOSPITAL_COMMUNITY): Payer: Self-pay | Admitting: Psychiatry

## 2020-09-28 DIAGNOSIS — F339 Major depressive disorder, recurrent, unspecified: Secondary | ICD-10-CM

## 2020-09-28 MED ORDER — SERTRALINE HCL 50 MG PO TABS: 50.0000 mg | ORAL_TABLET | Freq: Every day | ORAL | 1 refills | Status: DC

## 2020-09-28 MED ORDER — BUSPIRONE HCL 10 MG PO TABS: 10.0000 mg | ORAL_TABLET | Freq: Two times a day (BID) | ORAL | 1 refills | Status: DC

## 2020-09-28 MED FILL — SERTRALINE HCL 50 MG TABLET: 50 | 30 days supply | Qty: 30 | Fill #0

## 2020-09-28 MED FILL — busPIRone HCL 10 MG TABS: 10 | 30 days supply | Qty: 60 | Fill #0

## 2020-09-28 NOTE — Progress Notes (Signed)
BH OP Progress Note    Virtual Visit via Video Note  I connected with Lori Ponce on 09/28/20 at  3:20 PM EST by a video enabled telemedicine application and verified that I am speaking with the correct person using two identifiers.  Location: Patient: Home Provider: Clinic   I discussed the limitations of evaluation and management by telemedicine and the availability of in person appointments. The patient expressed understanding and agreed to proceed.  I provided 19 minutes of non-face-to-face time during this encounter.    Patient Identification: Lori Ponce MRN:  [id number] Date of Evaluation:  09/28/2020    Chief Complaint:   " My medicines are not working anymore."  Visit Diagnosis:    ICD-10-CM   1. Major depressive disorder, recurrent episode with anxious distress (HCC)  F33.9     History of Present Illness: Patient was seen for follow-up today.  Patient reported that she left her job at [employer] because of the toxic work environment about 3 weeks ago.  She stated that since she left her job she has been feeling very depressed. She stated that she feels depressed and does not feel like doing anything.  She used to enjoy spending time by herself but lately that also is not joyful anymore.  She has been having a lot of anxiety lately.  She cannot focus on anything.  She feels like she will cry easily.  Her energy levels are low. She has not had any suicidal ideations or any urges to engage in self-injurious behavior. She stated that she has been having mental breakdowns almost every day now.  She described her mental breakdown as an episode when she feels that her anxiety is overtaking her and she feels her palms are sweaty and she cannot catch her next breath.  She stated that she feels like they are more like panic attacks.  She stated that she ran out of her medications Lexapro and hydroxyzine about 2 days ago and after she stopped them she cannot really  tell a big difference.  She denied having any withdrawal effects after stopping Lexapro.  Writer asked her if she would like to try different medication regimen and she was agreeable to that.  After her trial of sertraline to help with her depression anxiety symptoms.  Writer also recommended trial of buspirone for anxiety symptoms. Potential side effects of medication and risks vs benefits of treatment vs non-treatment were explained and discussed. All questions were answered.  She informed that she has already started her new job at [employer] and so far is liking it there.  She denied use of any illicit substances or excessive consumption of alcohol recently. Her partner has been supportive of her.   Past Psychiatric History: MDD, 2 previous psychiatric admissions.  Most recent hospitalization 04/21/2020 for suicidal ideations.  Last hospitalization was in 2014 after a suicide attempt when she cut her wrist.  She has history of prior suicide attempts by overdosing as well.  Also has history of excessive alcohol consumption and marijuana use.  Previous Psychotropic Medications: Yes , Trazodone- caused her to have frequent headaches    Past Medical History: No past medical history on file.  Past Surgical History:  Procedure Laterality Date   NO PAST SURGERIES      Family Psychiatric History: denied  Family History:  Family History  Problem Relation Age of Onset   Diabetes Maternal Grandmother    Diabetes Maternal Grandfather     Social History:  Social History   Socioeconomic History   Marital status: Single    Spouse name: Not on file   Number of children: 1   Years of education: 10 th grade   Highest education level: Not on file  Occupational History   Not on file  Tobacco Use   Smoking status: Never Smoker   Smokeless tobacco: Never Used  Vaping Use   Vaping Use: Never used  Substance and Sexual Activity   Alcohol use: Yes    Comment: occasionally    Drug use: Yes    Frequency: 2.0 times per week    Types: Marijuana   Sexual activity: Yes    Birth control/protection: None  Other Topics Concern   Not on file  Social History Narrative   Not on file   Social Determinants of Health   Financial Resource Strain: Not on file  Food Insecurity: Not on file  Transportation Needs: Not on file  Physical Activity: Not on file  Stress: Not on file  Social Connections: Not on file     Allergies:  No Known Allergies  Metabolic Disorder Labs: No results found for: HGBA1C, MPG No results found for: PROLACTIN No results found for: CHOL, TRIG, HDL, CHOLHDL, VLDL, LDLCALC No results found for: TSH  Therapeutic Level Labs: No results found for: LITHIUM No results found for: CBMZ No results found for: VALPROATE  Current Medications: Current Outpatient Medications  Medication Sig Dispense Refill   escitalopram (LEXAPRO) 20 MG tablet Take 1 tablet (20 mg total) by mouth daily. 30 tablet 3   hydrOXYzine (ATARAX/VISTARIL) 10 MG tablet Take 1 tablet (10 mg total) by mouth 3 (three) times daily as needed for anxiety. 90 tablet 1   hydrOXYzine (ATARAX/VISTARIL) 50 MG tablet Take 1 tablet (50 mg total) by mouth at bedtime. 30 tablet 3   No current facility-administered medications for this visit.     Psychiatric Specialty Exam: Review of Systems  There were no vitals taken for this visit.There is no height or weight on file to calculate BMI.  General Appearance: Fairly Groomed  Eye Contact:  Good  Speech:  Clear and Coherent and Normal Rate  Volume:  Normal  Mood:  Anxious and Depressed  Affect:  Congruent  Thought Process:  Goal Directed and Descriptions of Associations: Intact  Orientation:  Full (Time, Place, and Person)  Thought Content:  Logical  Suicidal Thoughts:  No  Homicidal Thoughts:  No  Memory:  Immediate;   Good Recent;   Good  Judgement:  Fair  Insight:  Fair  Psychomotor Activity:  Normal  Concentration:   Concentration: Good and Attention Span: Good  Recall:  Good  Fund of Knowledge:Good  Language: Good  Akathisia:  Negative  Handed:  Right  AIMS (if indicated):  0  Assets:  Communication Skills Desire for Improvement Financial Resources/Insurance Housing Social Support  ADL's:  Intact  Cognition: WNL  Sleep:  Fair   Screenings: GAD-7   Garment/textile technologist Visit from 07/17/2020 in [facility] Counselor from 06/07/2020 in [facility] Office Visit from 05/15/2020 in [facility] Office Visit from 10/08/2019 in [facility]  Total GAD-7 Score 11 18 15 11     PHQ2-9   Flowsheet Row Office Visit from 07/17/2020 in [facility] Counselor from 06/07/2020 in [facility] Office Visit from 05/15/2020 in [facility] Office Visit  from 10/08/2019 in [facility]  PHQ-2 Total Score 2 4 4 3   PHQ-9 Total Score 8 19 15 8     Flowsheet Row Integrated Behavioral Health from 05/15/2020 in [facility]  C-SSRS RISK CATEGORY Low Risk      Assessment and Plan: Patient reported that she has been having ongoing depressive and anxiety symptoms for the last 3 weeks and she does not believe that her medication regimen Lexapro and hydroxyzine was being helpful.  She ran out of her medications 2 days ago.  She is agreeable to trial of different medications.  She was recommended trial of sertraline to target her depression and anxiety symptoms along with buspirone to target her anxiety symptoms. Potential side effects of medication and risks vs benefits of treatment vs non-treatment were explained and discussed. All questions were answered.    1. Major depressive disorder, recurrent episode with anxious distress (HCC)  - Start sertraline (ZOLOFT) 50 MG tablet;  Take 1 tablet (50 mg total) by mouth daily.  Dispense: 30 tablet; Refill: 1 - Start busPIRone (BUSPAR) 10 MG tablet; Take 1 tablet (10 mg total) by mouth 2 (two) times daily.  Dispense: 60 tablet; Refill: 1  She was advised to continue seeing Ms. Paige for therapy services. F/up in 6 weeks.   07/15/2020, MD 2/17/20223:07 PM

## 2020-10-13 ENCOUNTER — Ambulatory Visit (HOSPITAL_COMMUNITY): Payer: No Payment, Other | Admitting: Clinical

## 2020-11-10 ENCOUNTER — Ambulatory Visit (INDEPENDENT_AMBULATORY_CARE_PROVIDER_SITE_OTHER): Payer: No Payment, Other | Admitting: Clinical

## 2020-11-10 ENCOUNTER — Other Ambulatory Visit: Payer: Self-pay

## 2020-11-10 DIAGNOSIS — F331 Major depressive disorder, recurrent, moderate: Secondary | ICD-10-CM

## 2020-11-10 DIAGNOSIS — F419 Anxiety disorder, unspecified: Secondary | ICD-10-CM | POA: Diagnosis not present

## 2020-11-11 NOTE — Progress Notes (Signed)
   THERAPIST PROGRESS NOTE  Session Time: 35 minutes  Participation Level: Active  Behavioral Response: CasualAlertDepressed  Type of Therapy: Individual Therapy  Treatment Goals addressed: Diagnosis: depression  Interventions: CBT  Summary:  Lori Ponce is a 25 y.o. female who presents for the scheduled session oriented times five, appropriately dressed, and friendly. Client denied hallucinations and delusions. Client reported since the last session things have changed. Client reported her family has stopped being supportive of her since she decided she wants her daughter back in her care and not her moms. Client reported her family has vocalized they don't think she can handle having her daughter, working and finding a place because of her mental health. Client reported it is a shock because her family has always supported her decisions. Client reported it feels like her family wants her daughter and not her around. Client reported she is working on completing her GED and has the goal of going to aesthetician school. Client reported she is also thinking about taking her daughters father to court over custody. Client reported she has been trying to use these changes to motivate her to prove her family wrong.    Suicidal/Homicidal: Nowithout intent/plan  Therapist Response:  Therapist began the session checking in and asking how she has been doing since the last session. Therapist actively listened to the client thoughts and feelings. Therapist used CBT to give positive emotional support. Therapist used CBT to discuss changing thinking styles to view challenges in a way that can be more productive than harmful. Client was scheduled for next appointment.    Plan: Return again in 5 weeks for therapy.  Diagnosis: Recurrent moderate major depressive disorder with anxiety  Neena Rhymes Sarye Kath, LCSW 11/10/2020

## 2020-11-13 ENCOUNTER — Telehealth (HOSPITAL_COMMUNITY): Payer: No Payment, Other | Admitting: Psychiatry

## 2020-11-13 ENCOUNTER — Other Ambulatory Visit: Payer: Self-pay

## 2020-11-29 ENCOUNTER — Telehealth: Payer: Medicaid Other | Admitting: Family

## 2020-11-29 ENCOUNTER — Other Ambulatory Visit: Payer: Self-pay

## 2020-11-29 DIAGNOSIS — T148XXA Other injury of unspecified body region, initial encounter: Secondary | ICD-10-CM

## 2020-11-29 DIAGNOSIS — W540XXA Bitten by dog, initial encounter: Secondary | ICD-10-CM

## 2020-11-29 MED ORDER — SULFAMETHOXAZOLE-TRIMETHOPRIM 800-160 MG PO TABS
1.0000 | ORAL_TABLET | Freq: Two times a day (BID) | ORAL | 0 refills | Status: DC
  Filled 2020-11-29: qty 14, 7d supply, fill #0

## 2020-11-29 NOTE — Progress Notes (Signed)
E Visit for Cellulitis ° °We are sorry that you are not feeling well. Here is how we plan to help! ° °Based on what you shared with me it looks like you have cellulitis.  Cellulitis looks like areas of skin redness, swelling, and warmth; it develops as a result of bacteria entering under the skin. Little red spots and/or bleeding can be seen in skin, and tiny surface sacs containing fluid can occur. Fever can be present. Cellulitis is almost always on one side of a body, and the lower limbs are the most common site of involvement.  ° °I have prescribed:  Bactrim DS 1 tablet by mouth twice a day for 7 days ° °HOME CARE: ° °Take your medications as ordered and take all of them, even if the skin irritation appears to be healing.  ° °GET HELP RIGHT AWAY IF: ° °Symptoms that don't begin to go away within 48 hours. °Severe redness persists or worsens °If the area turns color, spreads or swells. °If it blisters and opens, develops yellow-brown crust or bleeds. °You develop a fever or chills. °If the pain increases or becomes unbearable.  °Are unable to keep fluids and food down. ° °MAKE SURE YOU  ° °Understand these instructions. °Will watch your condition. °Will get help right away if you are not doing well or get worse. ° °Thank you for choosing an e-visit. ° °Your e-visit answers were reviewed by a board certified advanced clinical practitioner to complete your personal care plan. Depending upon the condition, your plan could have included both over the counter or prescription medications. ° °Please review your pharmacy choice. Make sure the pharmacy is open so you can pick up prescription now. If there is a problem, you may contact your provider through MyChart messaging and have the prescription routed to another pharmacy.  Your safety is important to us. If you have drug allergies check your prescription carefully.  ° °For the next 24 hours you can use MyChart to ask questions about today's visit, request a  non-urgent call back, or ask for a work or school excuse. °You will get an email in the next two days asking about your experience. I hope that your e-visit has been valuable and will speed your recovery. ° °

## 2020-12-06 ENCOUNTER — Other Ambulatory Visit: Payer: Self-pay | Admitting: Family

## 2020-12-06 NOTE — Progress Notes (Signed)
Approximately 5 minutes was spent documenting and reviewing patient's chart.   

## 2020-12-18 ENCOUNTER — Ambulatory Visit (HOSPITAL_COMMUNITY): Payer: No Payment, Other | Admitting: Clinical

## 2021-01-09 ENCOUNTER — Ambulatory Visit (HOSPITAL_COMMUNITY): Payer: Self-pay | Admitting: Clinical

## 2021-02-05 ENCOUNTER — Ambulatory Visit: Payer: Medicaid Other | Admitting: Internal Medicine

## 2021-02-08 ENCOUNTER — Other Ambulatory Visit: Payer: Self-pay

## 2021-02-08 ENCOUNTER — Encounter (HOSPITAL_COMMUNITY): Payer: Self-pay | Admitting: Psychiatry

## 2021-02-08 ENCOUNTER — Ambulatory Visit (INDEPENDENT_AMBULATORY_CARE_PROVIDER_SITE_OTHER): Payer: No Payment, Other | Admitting: Psychiatry

## 2021-02-08 VITALS — BP 125/78 | HR 67 | Ht 62.0 in | Wt 129.0 lb

## 2021-02-08 DIAGNOSIS — F339 Major depressive disorder, recurrent, unspecified: Secondary | ICD-10-CM | POA: Diagnosis not present

## 2021-02-08 DIAGNOSIS — F1021 Alcohol dependence, in remission: Secondary | ICD-10-CM

## 2021-02-08 MED ORDER — TRAZODONE HCL 50 MG PO TABS
50.0000 mg | ORAL_TABLET | Freq: Every evening | ORAL | 2 refills | Status: DC | PRN
  Filled 2021-02-08: qty 30, 30d supply, fill #0

## 2021-02-08 MED ORDER — FLUOXETINE HCL 10 MG PO CAPS
10.0000 mg | ORAL_CAPSULE | Freq: Every day | ORAL | 2 refills | Status: DC
  Filled 2021-02-08: qty 30, 30d supply, fill #0

## 2021-02-08 MED ORDER — HYDROXYZINE HCL 25 MG PO TABS
25.0000 mg | ORAL_TABLET | Freq: Three times a day (TID) | ORAL | 2 refills | Status: DC | PRN
Start: 2021-02-08 — End: 2021-04-30
  Filled 2021-02-08: qty 90, 30d supply, fill #0

## 2021-02-08 NOTE — Progress Notes (Addendum)
BH OP Progress Note     Patient Identification: Lori Ponce MRN:  [id number] Date of Evaluation:  02/08/2021    Chief Complaint:  " I am here as I need to be on some medication to help me."  Visit Diagnosis:    ICD-10-CM   1. Major depressive disorder, recurrent episode with anxious distress (HCC)  F33.9 FLUoxetine (PROZAC) 10 MG capsule    hydrOXYzine (ATARAX/VISTARIL) 25 MG tablet    traZODone (DESYREL) 50 MG tablet      History of Present Illness: This is a 25 year old female with history of major depressive disorder with anxious distress and alcohol use disorder now seen for urgent evaluation after presenting as a walk-in. Patient was last seen by the writer on February 17 for a virtual visit.  Patient no showed for her subsequent appointment with the writer.  She was also connected with a therapist Ms. Idalia Needle in this clinic and she no showed for her last 2 appointments in April and May.  Today, patient was noted to be very neatly dressed.  She seemed to be very well put together.  She stated that she was in a deep state of depression for the past few months and that is why she no showed for her appointment with the writer and the therapist.  She stated that she was on extended leave from her work at [employer] since May.  She is supposed to return back to work in July. She stated that last week she felt very depressed and overwhelmed.  She also acknowledged drinking heavily over the last 2 to 3 months.  She stated that she was mostly consuming 2-3 shots of tequila every night.  She stated that she felt compelled to seek help.  She was hospitalized at [facility] psychiatry unit for about a week and was just discharged yesterday.  Writer checked with the front desk staff to see if we had received any discharge paperwork from the old [facility] for the patient but we did not receive anything.  Patient stated that during her hospital stay she refused to take medication that they  were prescribing to her.  She could not recall what it was but she did not think it would help her.  She did acknowledge taking trazodone which helped her with sleep at night.  She recalls it was a 50 mg dose.  Of note patient has taken trazodone in the past that had developed severe headaches to it.  She denied having any such side effects this time and stated that she would like to continue taking it for sleep as it helps.  She stated that she is temporarily taking a break from her relationship from her partner.  Her plan is to move out soon.  She then informed that she will be going to [facility] next week and will be completing their 35-month program as she wants to get sober and back on track in her life.  Her 65-year-old daughter has been living with her mother and will remain with her until the patient completes her program at [facility].  She stated that her daughter is doing okay and she is safe with her mother.  She stated that she was feeling very depressed and also had some passive suicidal ideations but denied any attempts to hurt herself.  She has been having low energy levels with low motivation to do anything.  She has not had any drink since June 21.  She also endorsed poor appetite and  poor sleep and poor concentration.  She reported that she had a lot of triggers mainly family problems that caused her to turn to alcohol and made her feel very depressed.  Also reported feeling very anxious and on edge on most of the days.  She had a hard time relaxing and felt being keyed up on most of the days.  She also endorsed racing thoughts as her mind would not shut down because she kept worrying about her future and what if things won't work out.  She stated that she is scheduled to go to [facility] on July 4 next week and is hoping that she will be able to stay away from alcohol till then.  She denied any hallucinations or delusions.  She denied any symptom suggestive of hypomania or  mania. She denied any symptoms just of PTSD.  Other than alcohol, she has not used any illicit substances.  She will be returning back to her full-time position at [employer] next month however also was working part-time as a Production assistant, radio at a [employer].  She stated that she knows that is like a trigger and therefore she will probably not be going back as soon as she can figure out her financial situation.  Writer asked the patient if she would like to try a different medication to help her symptoms or if she would be interested in retrying Lexapro or Effexor.  She stated that she would prefer trying a different medication.  Based on this writer recommended trying Prozac to see if that would help her symptoms of depression anxiety.  Writer also recommended returning back to taking hydroxyzine to help with anxiety.  She also prefers to stay on trazodone to help with sleep as it helped her during her hospital stay. Potential side effects of medication and risks vs benefits of treatment vs non-treatment were explained and discussed. All questions were answered.    Past Psychiatric History: MDD, 3 previous psychiatric admissions.  Recently admitted at old [facility] for about a week, discharged on June 29 for worsening depression.  Prior to that was hospitalized in 04/2020 for suicidal ideations and before that was admitted in 2014 after a suicide attempt when she cut her wrist.  She has history of prior suicide attempts by overdosing as well.  Also has history of excessive alcohol consumption and marijuana use.  Previous Psychotropic Medications: Yes , Trazodone- caused her to have frequent headaches in the past.  Has taken sertraline with limited effects.  Also took buspirone which helped only partially.  Was then switched to Lexapro and hydroxyzine but did not find them to be too effective.  Was also drinking alcohol along with the medications.  Patient reports that she has taken Effexor in the past  and she does not think she did well on it.   Past Medical History: No past medical history on file.  Past Surgical History:  Procedure Laterality Date   NO PAST SURGERIES      Family Psychiatric History: denied  Family History:  Family History  Problem Relation Age of Onset   Diabetes Maternal Grandmother    Diabetes Maternal Grandfather     Social History:   Social History   Socioeconomic History   Marital status: Single    Spouse name: Not on file   Number of children: 1   Years of education: 10 th grade   Highest education level: Not on file  Occupational History   Not on file  Tobacco Use  Smoking status: Never   Smokeless tobacco: Never  Vaping Use   Vaping Use: Never used  Substance and Sexual Activity   Alcohol use: Yes    Comment: occasionally   Drug use: Yes    Frequency: 2.0 times per week    Types: Marijuana   Sexual activity: Yes    Birth control/protection: None  Other Topics Concern   Not on file  Social History Narrative   Not on file   Social Determinants of Health   Financial Resource Strain: Not on file  Food Insecurity: Not on file  Transportation Needs: Not on file  Physical Activity: Not on file  Stress: Not on file  Social Connections: Not on file     Allergies:  No Known Allergies  Metabolic Disorder Labs: No results found for: HGBA1C, MPG No results found for: PROLACTIN No results found for: CHOL, TRIG, HDL, CHOLHDL, VLDL, LDLCALC No results found for: TSH  Therapeutic Level Labs: No results found for: LITHIUM No results found for: CBMZ No results found for: VALPROATE  Current Medications: Current Outpatient Medications  Medication Sig Dispense Refill   FLUoxetine (PROZAC) 10 MG capsule Take 1 capsule (10 mg total) by mouth daily. 30 capsule 2   hydrOXYzine (ATARAX/VISTARIL) 25 MG tablet Take 1 tablet (25 mg total) by mouth 3 (three) times daily as needed for anxiety. 90 tablet 2   sulfamethoxazole-trimethoprim  (BACTRIM DS) 800-160 MG tablet Take 1 tablet by mouth 2 (two) times daily. 14 tablet 0   traZODone (DESYREL) 50 MG tablet Take 1 tablet (50 mg total) by mouth at bedtime as needed for sleep. 30 tablet 2   No current facility-administered medications for this visit.     Psychiatric Specialty Exam: Review of Systems  Blood pressure 125/78, pulse 67, height 5\' 2"  (1.575 m), weight 129 lb (58.5 kg).Body mass index is 23.59 kg/m.  General Appearance: Well Groomed  Eye Contact:  Good  Speech:  Clear and Coherent and Normal Rate  Volume:  Normal  Mood:  Depressed  Affect:  Congruent  Thought Process:  Goal Directed and Descriptions of Associations: Intact  Orientation:  Full (Time, Place, and Person)  Thought Content:  Logical  Suicidal Thoughts:  No  Homicidal Thoughts:  No  Memory:  Immediate;   Good Recent;   Good  Judgement:  Fair  Insight:  Fair  Psychomotor Activity:  Normal  Concentration:  Concentration: Good and Attention Span: Good  Recall:  Good  Fund of Knowledge:Good  Language: Good  Akathisia:  Negative  Handed:  Right  AIMS (if indicated):  0  Assets:  Communication Skills Desire for Improvement Financial Resources/Insurance Housing Social Support  ADL's:  Intact  Cognition: WNL  Sleep:  Poor   Screenings: GAD-7    Flowsheet Row Clinical [facility] from [facility]2020 [facility]  Total GAD-7 Score 12 11 18 15 11       PHQ2-9    Flowsheet Row Clinical Support [facility] 02/[facility]20[facility] Counsel[facility] from [facility]2020 [facility] from [facility]07[facility]2020 [facility]  PHQ-2 Total Score 4 1 2 4 4   PHQ-9 Total Score 14 8 8 19 15       Flowsheet Row Clinical Support from 02/08/2021 in [facility] Counselor from 11/10/2020 in [facility] from 05/15/2020 in [facility]  C-SSRS RISK CATEGORY Error: Q7 should not be populated when Q6 is No No Risk Low Risk       Assessment and Plan: 25 year old female with history of MDD with anxious distress and alcohol use disorder now seen after recent hospitalization at old [facility].  She reported that she did not give consent to start any medications during her hospital stay as she did not want what they were giving her.  She did take trazodone for sleep which was helpful.  She does not want to retry any of the medication she is taken in the past and asked if she could try different medication today.  She is agreeable to trying Prozac to help with her depression and anxiety symptoms along with hydroxyzine for breakthrough anxiety.  We will continue trazodone to help with sleep. She is scheduled to go to [facility] next week for rehab.  Her 27-year-old daughter will remain with patient's mother while she is at the rehab facility.  1. Major depressive disorder, recurrent episode with anxious distress (HCC)  - FLUoxetine (PROZAC) 10 MG capsule; Take 1 capsule (10 mg total) by mouth daily.  Dispense: 30 capsule; Refill: 2 - hydrOXYzine (ATARAX/VISTARIL) 25 MG tablet; Take 1 tablet (25 mg total) by mouth 3 (three) times daily as needed for anxiety.  Dispense: 90 tablet; Refill: 2 - traZODone (DESYREL) 50 MG tablet; Take 1 tablet (50 mg total) by mouth at bedtime as needed for sleep.  Dispense: 30 tablet; Refill: 2  2. Alcohol use disorder, severe, in early  remission, dependence (HCC) -Patient is going to residential rehab facility [facility] next week.  She is excited about this opportunity and wants to make this plan work.  Patient reported that [facility] and would like to continue seeing them. Follow-up in 6-7 weeks. Patient was encouraged to come to go [facility] behavioral health urgent care center in case she has recurring suicidal ideations or any urges to hurt herself.  Patient was informed that writer is leaving the office therefore her care is being transferred to a different provider in the clinic.  She verbalized her understanding.   [place], MD 6/30/20229:10 AM  ADDENDUM: The front desk staff received discharge paperwork from old [facility] regarding this patient around 9:45 AM.  Writer reviewed the discharge summary and as per the documentation patient was admitted there from June 23 to June 29 for alcohol detoxification.  She was given the diagnosis of mood disorder unspecified and alcohol use disorder uncomplicated.  In addition to blood alcohol level being high she also tested positive for marijuana on her urine drug screen.  She was not discharged on any psychotropic medications.  June 25, MD 02/08/2021 10:52 AM

## 2021-03-27 ENCOUNTER — Ambulatory Visit
Admission: EM | Admit: 2021-03-27 | Discharge: 2021-03-27 | Disposition: A | Discharge status: Home / Self Care | Payer: Medicaid Other | Attending: Urgent Care | Admitting: Urgent Care

## 2021-03-27 ENCOUNTER — Other Ambulatory Visit: Payer: Self-pay

## 2021-03-27 DIAGNOSIS — F329 Major depressive disorder, single episode, unspecified: Secondary | ICD-10-CM

## 2021-03-27 DIAGNOSIS — F1011 Alcohol abuse, in remission: Secondary | ICD-10-CM

## 2021-03-27 DIAGNOSIS — R42 Dizziness and giddiness: Secondary | ICD-10-CM

## 2021-03-27 NOTE — ED Provider Notes (Signed)
[facility]   MRN: [id number] DOB: 12/18/1995  Subjective:   Stanislawa Gaffin is a 25 y.o. female presenting for 1 day history of acute onset of dizziness.  Symptoms started when she was at a gas station.  She was not doing anything particularly strenuous.  Tries to hydrate very well.  Denies fever, confusion, vision changes, ear pain, throat pain, chest pain, shob, nausea, vomiting, abdominal pain, dysuria, hematuria, rashes, vaginal discharge.  Patient is not concerned for pregnancy at all, does not want testing.  She does have a history of depression and stopped taking her medications for this months ago.  She prefers to take a holistic approach.  She is not using any drugs, no alcohol.  She does have a history of alcohol abuse and alcohol use disorder, she is currently living at an [facility] for the same reason.  Admits that she has a lot of stressors with her living situation.  She is also having difficulty financially being able to afford her cost of living and bills in general.  Denies any thoughts of self-harm, SI, HI.  She admits that due to her difficulty with finances has not found it easy to eat regularly because it is expensive.  She did feel little better yesterday after eating some.  Has not had recurrence of dizziness today.  No current facility-administered medications for this encounter.  Current Outpatient Medications:    FLUoxetine (PROZAC) 10 MG capsule, Take 1 capsule (10 mg total) by mouth daily., Disp: 30 capsule, Rfl: 2   hydrOXYzine (ATARAX/VISTARIL) 25 MG tablet, Take 1 tablet (25 mg total) by mouth 3 (three) times daily as needed for anxiety., Disp: 90 tablet, Rfl: 2   sulfamethoxazole-trimethoprim (BACTRIM DS) 800-160 MG tablet, Take 1 tablet by mouth 2 (two) times daily., Disp: 14 tablet, Rfl: 0   traZODone (DESYREL) 50 MG tablet, Take 1 tablet (50 mg total) by mouth at bedtime as needed for sleep., Disp: 30 tablet, Rfl: 2   No Known  Allergies  History reviewed. No pertinent past medical history.   Past Surgical History:  Procedure Laterality Date   NO PAST SURGERIES      Family History  Problem Relation Age of Onset   Diabetes Maternal Grandmother    Diabetes Maternal Grandfather     Social History   Tobacco Use   Smoking status: Never   Smokeless tobacco: Never  Vaping Use   Vaping Use: Never used  Substance Use Topics   Alcohol use: Yes    Comment: occasionally   Drug use: Yes    Frequency: 2.0 times per week    Types: Marijuana    ROS   Objective:   Vitals: BP 106/68 (BP Location: Left Arm)   Pulse 63   Temp 98.5 F (36.9 C) (Oral)   Resp 18   SpO2 99%   Physical Exam Constitutional:      General: She is not in acute distress.    Appearance: Normal appearance. She is well-developed. She is not ill-appearing, toxic-appearing or diaphoretic.  HENT:     Head: Normocephalic and atraumatic.     Right Ear: Tympanic membrane, ear canal and external ear normal. No drainage or tenderness. No middle ear effusion. There is no impacted cerumen. Tympanic membrane is not erythematous.     Left Ear: Tympanic membrane, ear canal and external ear normal. No drainage or tenderness.  No middle ear effusion. There is no impacted cerumen. Tympanic membrane is not erythematous.     Nose:  Nose normal. No congestion or rhinorrhea.     Mouth/Throat:     Mouth: Mucous membranes are moist. No oral lesions.     Pharynx: Oropharynx is clear. No pharyngeal swelling, oropharyngeal exudate, posterior oropharyngeal erythema or uvula swelling.     Tonsils: No tonsillar exudate or tonsillar abscesses.  Eyes:     General: No scleral icterus.       Right eye: No discharge.        Left eye: No discharge.     Extraocular Movements: Extraocular movements intact.     Right eye: Normal extraocular motion.     Left eye: Normal extraocular motion.     Conjunctiva/sclera: Conjunctivae normal.     Pupils: Pupils are equal,  round, and reactive to light.  Cardiovascular:     Rate and Rhythm: Normal rate and regular rhythm.     Pulses: Normal pulses.     Heart sounds: Normal heart sounds. No murmur heard.   No friction rub. No gallop.  Pulmonary:     Effort: Pulmonary effort is normal. No respiratory distress.     Breath sounds: Normal breath sounds. No stridor. No wheezing, rhonchi or rales.  Musculoskeletal:     Cervical back: Normal range of motion and neck supple.  Lymphadenopathy:     Cervical: No cervical adenopathy.  Skin:    General: Skin is warm and dry.     Findings: No rash.  Neurological:     General: No focal deficit present.     Mental Status: She is alert and oriented to person, place, and time.     Cranial Nerves: No cranial nerve deficit.     Motor: No weakness.     Coordination: Coordination normal.     Gait: Gait normal.     Deep Tendon Reflexes: Reflexes normal.     Comments: Negative Romberg and pronator drift.   Psychiatric:        Mood and Affect: Mood normal. Mood is not anxious, depressed or elated. Affect is flat. Affect is not labile, blunt, angry, tearful or inappropriate.        Speech: She is communicative. Speech is not rapid and pressured, delayed, slurred or tangential.        Behavior: Behavior normal. Behavior is not agitated, slowed, aggressive, withdrawn, hyperactive or combative. Behavior is cooperative.        Thought Content: Thought content normal. Thought content does not include homicidal or suicidal ideation.        Judgment: Judgment normal.    Assessment and Plan :   PDMP not reviewed this encounter.  1. Dizziness   2. History of alcohol abuse   3. Major depressive disorder, remission status unspecified, unspecified whether recurrent     Undifferentiated dizziness.  Labs pending.  Recommended establishing care with a new PCP for recheck on her depression, anxiety and dizziness.  We will hold off on any medications as she is asymptomatic today.   Encouraged patient to continue with general health maintenance including eating regular healthy balanced meals, hydrating consistently.  Patient is hemodynamically stable, appropriate for outpatient management. Counseled patient on potential for adverse effects with medications prescribed/recommended today, ER and return-to-clinic precautions discussed, patient verbalized understanding.    Wallis Bamberg, PA-C 03/27/21 1515

## 2021-03-27 NOTE — ED Triage Notes (Signed)
Pt reports a sudden onset of dizziness yesterday as she walking out of the gas station. Pt sat in the car for a moment to recover. When she got home, she felt dizzy again and slipped while going down the steps. Pt was able to catch herself from falling. No LOC. Confirms slight HA. After the slip, Pt went to bed and reports waking up feeling dizzy. No meds taken.

## 2021-03-28 LAB — COMPREHENSIVE METABOLIC PANEL
ALT: 13 IU/L (ref 0–32)
AST: 16 IU/L (ref 0–40)
Albumin/Globulin Ratio: 1.8 (ref 1.2–2.2)
Albumin: 4.8 g/dL (ref 3.9–5.0)
Alkaline Phosphatase: 64 IU/L (ref 44–121)
BUN/Creatinine Ratio: 16 (ref 9–23)
BUN: 10 mg/dL (ref 6–20)
Bilirubin Total: 0.6 mg/dL (ref 0.0–1.2)
CO2: 22 mmol/L (ref 20–29)
Calcium: 9.5 mg/dL (ref 8.7–10.2)
Chloride: 102 mmol/L (ref 96–106)
Creatinine, Ser: 0.62 mg/dL (ref 0.57–1.00)
Globulin, Total: 2.7 g/dL (ref 1.5–4.5)
Glucose: 84 mg/dL (ref 65–99)
Potassium: 3.6 mmol/L (ref 3.5–5.2)
Sodium: 139 mmol/L (ref 134–144)
Total Protein: 7.5 g/dL (ref 6.0–8.5)
eGFR: 126 mL/min/{1.73_m2} (ref >59)

## 2021-03-28 LAB — CBC
Hematocrit: 36.5 % (ref 34.0–46.6)
Hemoglobin: 12.3 g/dL (ref 11.1–15.9)
MCH: 31.9 pg (ref 26.6–33.0)
MCHC: 33.7 g/dL (ref 31.5–35.7)
MCV: 95 fL (ref 79–97)
Platelets: 200 10*3/uL (ref 150–450)
RBC: 3.85 x10E6/uL (ref 3.77–5.28)
RDW: 12.5 % (ref 11.7–15.4)
WBC: 5.3 10*3/uL (ref 3.4–10.8)

## 2021-03-28 LAB — TSH: TSH: 0.902 u[IU]/mL (ref 0.450–4.500)

## 2021-04-02 ENCOUNTER — Other Ambulatory Visit: Payer: Self-pay

## 2021-04-02 ENCOUNTER — Ambulatory Visit: Payer: Medicaid Other | Attending: Internal Medicine | Admitting: Internal Medicine

## 2021-04-02 DIAGNOSIS — F1011 Alcohol abuse, in remission: Secondary | ICD-10-CM

## 2021-04-02 DIAGNOSIS — R42 Dizziness and giddiness: Secondary | ICD-10-CM

## 2021-04-02 NOTE — Progress Notes (Signed)
Virtual Visit via Video Note  I connected with Lori Ponce on 04/02/21 at 10:50 AM EDT by a video enabled telemedicine application and verified that I am speaking with the correct person using two identifiers.  Location: Patient: at [facility] Provider: office   I discussed the limitations of evaluation and management by telemedicine and the availability of in person appointments. The patient expressed understanding and agreed to proceed.  History of Present Illness: Patient with history of depression/anxiety, genital herpes, migraines, ETOH use disorder.  Patient last evaluated 08/2020.  Purpose of today's visit was to follow-up from urgent care. Patient seen at urgent care 03/27/2021 with complaint of dizziness x1 day.  Vitals were stable.  Exam unrevealing.  Normal TSH, CBC and chemistry.  Today: Patient states she is doing better.  She thinks the dizziness may have been due to low blood pressure which she attributes to not hydrating well and decreased appetite.  However she reports that she has been trying to do better at both.  She is currently at [facility] for history of EtOH abuse.  She has been there since July 4 and has been clean for 61 days.  She plans to stay there for a full year.  Denies any dizziness at this point.  Outpatient Encounter Medications as of 04/02/2021  Medication Sig   FLUoxetine (PROZAC) 10 MG capsule Take 1 capsule (10 mg total) by mouth daily.   hydrOXYzine (ATARAX/VISTARIL) 25 MG tablet Take 1 tablet (25 mg total) by mouth 3 (three) times daily as needed for anxiety.   sulfamethoxazole-trimethoprim (BACTRIM DS) 800-160 MG tablet Take 1 tablet by mouth 2 (two) times daily.   traZODone (DESYREL) 50 MG tablet Take 1 tablet (50 mg total) by mouth at bedtime as needed for sleep.   [DISCONTINUED] escitalopram (LEXAPRO) 20 MG tablet Take 1 tablet (20 mg total) by mouth daily.   No facility-administered encounter medications on file as of 04/02/2021.       Observations/Objective: Young appearing Hispanic female in NAD.  She is [place] speaking. Results for orders placed or performed during the hospital encounter of 03/27/21  Comprehensive metabolic panel  Result Value Ref Range   Glucose 84 65 - 99 mg/dL   BUN 10 6 - 20 mg/dL   Creatinine, Ser 0.62 0.57 - 1.00 mg/dL   eGFR 126 >59 mL/min/1.73   BUN/Creatinine Ratio 16 9 - 23   Sodium 139 134 - 144 mmol/L   Potassium 3.6 3.5 - 5.2 mmol/L   Chloride 102 96 - 106 mmol/L   CO2 22 20 - 29 mmol/L   Calcium 9.5 8.7 - 10.2 mg/dL   Total Protein 7.5 6.0 - 8.5 g/dL   Albumin 4.8 3.9 - 5.0 g/dL   Globulin, Total 2.7 1.5 - 4.5 g/dL   Albumin/Globulin Ratio 1.8 1.2 - 2.2   Bilirubin Total 0.6 0.0 - 1.2 mg/dL   Alkaline Phosphatase 64 44 - 121 IU/L   AST 16 0 - 40 IU/L   ALT 13 0 - 32 IU/L  CBC  Result Value Ref Range   WBC 5.3 3.4 - 10.8 x10E3/uL   RBC 3.85 3.77 - 5.28 x10E6/uL   Hemoglobin 12.3 11.1 - 15.9 g/dL   Hematocrit 36.5 34.0 - 46.6 %   MCV 95 79 - 97 fL   MCH 31.9 26.6 - 33.0 pg   MCHC 33.7 31.5 - 35.7 g/dL   RDW 12.5 11.7 - 15.4 %   Platelets 200 150 - 450 x10E3/uL  TSH  Result Value Ref Range   TSH 0.902 0.450 - 4.500 uIU/mL     Assessment and Plan: 1. Dizziness -Resolved. -Encouraged her to drink 4 to 8 glasses of water daily.  If she feels she is not able to eat 3 large meals a day, I recommend eating smaller more frequent meals.  2.  Alcohol abuse in remission Commended her on quitting.  Encouraged her to remain free of alcoholic beverages  Follow Up Instructions: As needed   I discussed the assessment and treatment plan with the patient. The patient was provided an opportunity to ask questions and all were answered. The patient agreed with the plan and demonstrated an understanding of the instructions.   The patient was advised to call back or seek an in-person evaluation if the symptoms worsen or if the condition fails to improve as anticipated.  I spent 9  minutes dedicated to the care of this patient on the date of this encounter to include previsit review of note from her recent urgent care visit and review of labs done on that visit, face-to-face time with the patient including time discussing diagnosis and management.    Karle Plumber, MD

## 2021-04-09 ENCOUNTER — Ambulatory Visit (INDEPENDENT_AMBULATORY_CARE_PROVIDER_SITE_OTHER): Payer: No Payment, Other | Admitting: Psychiatry

## 2021-04-09 ENCOUNTER — Encounter (HOSPITAL_COMMUNITY): Payer: Self-pay | Admitting: Psychiatry

## 2021-04-09 ENCOUNTER — Other Ambulatory Visit: Payer: Self-pay

## 2021-04-09 VITALS — BP 88/52 | HR 70 | Temp 98.2°F | Wt 128.2 lb

## 2021-04-09 DIAGNOSIS — F1011 Alcohol abuse, in remission: Secondary | ICD-10-CM | POA: Diagnosis not present

## 2021-04-09 NOTE — Progress Notes (Signed)
BH MD/PA/NP OP Progress Note  04/09/2021 2:36 PM Lori Ponce  MRN:  [id number]  Chief Complaint: "I discontinued by medications and has been sober for 68 days"  HPI: 25 year old female seen today for follow-up psychiatric evaluation, she is a former patient of Dr. Quintella Baton is being transferred to writer for medication management.  She has a psychiatric history of alcohol use, SI, anxiety, and depression.  She was managed on Prozac 10 mg daily, hydroxyzine 25 mg 3 times daily, and trazodone 50 mg nightly as needed.  She informed Clinical research associate that she can discontinue these medications.  Today she is well-groomed, pleasant, cooperative, and engaged in conversation.  She informed Clinical research associate that overall she is doing well.  She notes that she has been sober from alcohol and illegal substances for 68 days.  She informed Clinical research associate that she follows up with [facility], has sponsors at her [employer], and feels supported by her peers/family.  Today provider conducted a GAD-7 and patient scored a 7.  Provider also conducted PHQ-9 and patient scored a 7.  She endorses having a poor appetite noting that at times she cannot afford food.  She endorses adequate sleep.  Today she denies SI/HI/VAH, mania, or paranoia.  Patient notes that she has been studying for her GED and plans to take her exam on Thursday or Friday.  She also notes that her 51-year-old daughter is doing well and notes that she started kindergarten today.  She informed Clinical research associate that she is proud of her daughter.  Patient informed Clinical research associate that she wants to maintain her sobriety and plans to stay at her [place] house for at least a year.  At this time patient does not want to restart medications.  She will follow-up with provider in 3 months for further evaluation.  She will also follow[facility].  No other concerns at this time  Visit Diagnosis:    ICD-10-CM   1. Alcohol use disorder,  mild, in early remission  F10.11       Past Psychiatric History: MDD, anxiety, alcohol use, 3 previous psychiatric admissions.  Recently admitted at [facility] for about a week, discharged on June 29 for worsening depression.  Prior to that was hospitalized in 04/2020 for suicidal ideations and before that was admitted in 2014 after a suicide attempt when she cut her wrist.  She has history of prior suicide attempts by overdosing as well.  Also has history of excessive alcohol consumption and marijuana use  Past Medical History: No past medical history on file.  Past Surgical History:  Procedure Laterality Date   NO PAST SURGERIES      Family Psychiatric History: Denies  Family History:  Family History  Problem Relation Age of Onset   Diabetes Maternal Grandmother    Diabetes Maternal Grandfather     Social History:  Social History   Socioeconomic History   Marital status: Single    Spouse name: Not on file   Number of children: 1   Years of education: 10 th grade   Highest education level: Not on file  Occupational History   Not on file  Tobacco Use   Smoking status: Never   Smokeless tobacco: Never  Vaping Use   Vaping Use: Never used  Substance and Sexual Activity   Alcohol use: Yes    Comment: occasionally   Drug use: Yes    Frequency: 2.0 times per week    Types: Marijuana  Sexual activity: Yes    Birth control/protection: None  Other Topics Concern   Not on file  Social History Narrative   Not on file   Social Determinants of Health   Financial Resource Strain: Not on file  Food Insecurity: Not on file  Transportation Needs: Not on file  Physical Activity: Not on file  Stress: Not on file  Social Connections: Not on file    Allergies: No Known Allergies  Metabolic Disorder Labs: No results found for: HGBA1C, MPG No results found for: PROLACTIN No results found for: CHOL, TRIG, HDL, CHOLHDL, VLDL, LDLCALC Lab Results  Component Value  Date   TSH 0.902 03/27/2021    Therapeutic Level Labs: No results found for: LITHIUM No results found for: VALPROATE No components found for:  CBMZ  Current Medications: Current Outpatient Medications  Medication Sig Dispense Refill   FLUoxetine (PROZAC) 10 MG capsule Take 1 capsule (10 mg total) by mouth daily. 30 capsule 2   hydrOXYzine (ATARAX/VISTARIL) 25 MG tablet Take 1 tablet (25 mg total) by mouth 3 (three) times daily as needed for anxiety. 90 tablet 2   sulfamethoxazole-trimethoprim (BACTRIM DS) 800-160 MG tablet Take 1 tablet by mouth 2 (two) times daily. 14 tablet 0   traZODone (DESYREL) 50 MG tablet Take 1 tablet (50 mg total) by mouth at bedtime as needed for sleep. 30 tablet 2   No current facility-administered medications for this visit.     Musculoskeletal: Strength & Muscle Tone: within normal limits Gait & Station: normal Patient leans: N/A  Psychiatric Specialty Exam: Review of Systems  There were no vitals taken for this visit.There is no height or weight on file to calculate BMI.  General Appearance: Well Groomed  Eye Contact:  Good  Speech:  Clear and Coherent and Normal Rate  Volume:  Normal  Mood:  Euthymic  Affect:  Appropriate and Congruent  Thought Process:  Coherent, Goal Directed, and Linear  Orientation:  Full (Time, Place, and Person)  Thought Content: WDL and Logical   Suicidal Thoughts:  No  Homicidal Thoughts:  No  Memory:  Immediate;   Good Recent;   Good Remote;   Good  Judgement:  Good  Insight:  Good  Psychomotor Activity:  Normal  Concentration:  Concentration: Good and Attention Span: Good  Recall:  Good  Fund of Knowledge: Good  Language: Good  Akathisia:  No  Handed:  Right  AIMS (if indicated): not done  Assets:  Communication Skills Desire for Improvement Financial Resources/Insurance Housing Physical Health Social Support  ADL's:  Intact  Cognition: WNL  Sleep:  Good   Screenings: GAD-7    Flowsheet Row  Clinical Support from 04/09/2021 in [facility] Clinical Support from 02/08/2021 in [facility] Office Visit from 07/17/2020 in [facility] Counselor from 06/07/2020 in [facility] Office Visit from 05/15/2020 in [facility]  Total GAD-7 Score 7 12 11 18 15       PHQ2-9    Flowsheet Row Clinical Support from 04/09/2021 in [facility] Clinical Support from 02/08/2021 in [facility] Counselor from 11/10/2020 in [facility] Office Visit from 07/17/2020 in [facility] Counselor from 06/07/2020 in [facility]  PHQ-2 Total Score 0 4 1 2 4   PHQ-9 Total Score 7 14 8 8 19       Flowsheet  Row Clinical Support from 04/09/2021 in [facility] from 03/27/2021 in [facility]  Clinical Support from 02/08/2021 in [facility]  C-SSRS RISK CATEGORY Error: Question 6 not populated No Risk Error: Q7 should not be populated when Q6 is No        Assessment and Plan: Patient informed writer that she discontinued her medications and notes that she is doing well without them.  At this time no medications restarted.  Patient will follow-up in 3 months for further evaluation   1. Alcohol use disorder, mild, in early remission  Follow-up in 3 Follow[facility]  Shanna Cisco, NP 04/09/2021, 2:36 PM

## 2021-04-19 ENCOUNTER — Other Ambulatory Visit: Payer: Self-pay

## 2021-04-19 ENCOUNTER — Encounter: Payer: Self-pay | Admitting: Nurse Practitioner

## 2021-04-19 ENCOUNTER — Telehealth (INDEPENDENT_AMBULATORY_CARE_PROVIDER_SITE_OTHER): Payer: Self-pay | Admitting: Nurse Practitioner

## 2021-04-19 DIAGNOSIS — R531 Weakness: Secondary | ICD-10-CM

## 2021-04-19 DIAGNOSIS — R42 Dizziness and giddiness: Secondary | ICD-10-CM

## 2021-04-19 NOTE — Patient Instructions (Addendum)
Dizzy and Weak:  Will check labs - patient will come to the office tomorrow  Will check BP - patient will come to the office tomorrow  Stay well hydrated  Get plenty of rest    Follow up:  Follow up ASAP with PCP

## 2021-04-19 NOTE — Progress Notes (Signed)
Virtual Visit via Telephone Note  I connected with Mercedees Convery on 04/19/21 at  3:50 PM EDT by telephone and verified that I am speaking with the correct person using two identifiers.  Location: Patient: home Provider: office   I discussed the limitations, risks, security and privacy concerns of performing an evaluation and management service by telephone and the availability of in person appointments. I also discussed with the patient that there may be a patient responsible charge related to this service. The patient expressed understanding and agreed to proceed.   History of Present Illness:  Patient presents today for acute visit through televisit.  Patient states for the last few days she has been dizzy and having weakness.  She states that she has been eating well and staying well-hydrated.  Patient has not been checking her blood pressure at home she is unsure if it is low or not.  She reports that she has not felt sick in any way.  We discussed that we can check blood work.  Patient states that she can come to the office tomorrow to have labs drawn.  We will try to have a nurse check her blood pressure as well when she comes in for labs tomorrow.       Observations/Objective:  Vitals with BMI 03/27/2021 07/17/2020 05/15/2020  Height - - -  Weight - 114 lbs 10 oz 111 lbs  BMI - - -  Systolic 106 114 681  Diastolic 68 69 69  Pulse 63 66 75  Some encounter information is confidential and restricted. Go to Review Flowsheets activity to see all data.      Assessment and Plan:  Dizzy and Weak:  Will check labs - patient will come to the office tomorrow  Will check BP - patient will come to the office tomorrow  Stay well hydrated  Get plenty of rest    Follow up:  Follow up ASAP with PCP     I discussed the assessment and treatment plan with the patient. The patient was provided an opportunity to ask questions and all were answered. The patient agreed with the  plan and demonstrated an understanding of the instructions.   The patient was advised to call back or seek an in-person evaluation if the symptoms worsen or if the condition fails to improve as anticipated.  I provided 23 minutes of non-face-to-face time during this encounter.   Ivonne Andrew, NP

## 2021-04-20 ENCOUNTER — Other Ambulatory Visit: Payer: Medicaid Other

## 2021-04-20 ENCOUNTER — Other Ambulatory Visit: Payer: Self-pay

## 2021-04-20 DIAGNOSIS — R42 Dizziness and giddiness: Secondary | ICD-10-CM

## 2021-04-20 DIAGNOSIS — R531 Weakness: Secondary | ICD-10-CM

## 2021-04-21 LAB — CBC
Hematocrit: 39.6 % (ref 34.0–46.6)
Hemoglobin: 13.5 g/dL (ref 11.1–15.9)
MCH: 31.6 pg (ref 26.6–33.0)
MCHC: 34.1 g/dL (ref 31.5–35.7)
MCV: 93 fL (ref 79–97)
Platelets: 208 10*3/uL (ref 150–450)
RBC: 4.27 x10E6/uL (ref 3.77–5.28)
RDW: 12.2 % (ref 11.7–15.4)
WBC: 5.7 10*3/uL (ref 3.4–10.8)

## 2021-04-21 LAB — TSH: TSH: 1.05 u[IU]/mL (ref 0.450–4.500)

## 2021-04-21 LAB — SPECIMEN STATUS REPORT

## 2021-04-30 ENCOUNTER — Ambulatory Visit: Payer: Self-pay | Admitting: Nurse Practitioner

## 2021-04-30 ENCOUNTER — Encounter: Payer: Self-pay | Admitting: Nurse Practitioner

## 2021-04-30 ENCOUNTER — Ambulatory Visit (INDEPENDENT_AMBULATORY_CARE_PROVIDER_SITE_OTHER): Payer: Self-pay | Admitting: Nurse Practitioner

## 2021-04-30 ENCOUNTER — Other Ambulatory Visit: Payer: Self-pay

## 2021-04-30 VITALS — BP 99/69 | HR 64 | Temp 97.5°F | Ht 62.1 in | Wt 129.0 lb

## 2021-04-30 DIAGNOSIS — Z0000 Encounter for general adult medical examination without abnormal findings: Secondary | ICD-10-CM

## 2021-04-30 LAB — POCT URINALYSIS DIP (CLINITEK)
Bilirubin, UA: NEGATIVE
Blood, UA: NEGATIVE
Glucose, UA: NEGATIVE mg/dL
Ketones, POC UA: NEGATIVE mg/dL
Nitrite, UA: NEGATIVE
POC PROTEIN,UA: NEGATIVE
Spec Grav, UA: 1.02 (ref 1.010–1.025)
Urobilinogen, UA: 1 E.U./dL
pH, UA: 6 (ref 5.0–8.0)

## 2021-04-30 NOTE — Progress Notes (Signed)
[place], [place]  [id number] Phone:  [phone]   Fax:  [phone] Subjective:   Patient ID: Lori Ponce, female    DOB: 1995-12-12, 25 y.o.   MRN: [id number]  Chief Complaint  Patient presents with   Establish Care    No questions or concerns. Stated she does not take any medications, since early this year   HPI [place] 25 y.o. female to establish care. Patient states that last visit with prior PCP 2 wks ago for dizziness.  Patient states that dizziness has improved since completing visit with previous PCP. Denies taking any medications, symptoms improved without intervention.  Does not currently monitor diet. Does not exercise. Denies any substance usage. Currently seeing psychiatrist and counselor regularly. Denies SI/HI. States that she stopped taking medications for anxiety and depression at the beginning of the year. Was initially coping with alcohol, but now uses healthy methods for coping. States, "I like to keep my mind busy, biking, running or reading books." Has been sober for 90 days, with the help of her sponsor. Currently works as a Dance movement psychotherapist. Denies any other complaints today.  No past medical history on file.  Past Surgical History:  Procedure Laterality Date   NO PAST SURGERIES      Family History  Problem Relation Age of Onset   Diabetes Maternal Grandmother    Diabetes Maternal Grandfather     Social History   Socioeconomic History   Marital status: Single    Spouse name: Not on file   Number of children: 1   Years of education: 10 th grade   Highest education level: Not on file  Occupational History   Not on file  Tobacco Use   Smoking status: Never   Smokeless tobacco: Never  Vaping Use   Vaping Use: Never used  Substance and Sexual Activity   Alcohol use: Yes    Comment: occasionally   Drug use: Yes    Frequency: 2.0 times per week    Types: Marijuana   Sexual activity: Yes     Birth control/protection: None  Other Topics Concern   Not on file  Social History Narrative   Not on file   Social Determinants of Health   Financial Resource Strain: Not on file  Food Insecurity: Not on file  Transportation Needs: Not on file  Physical Activity: Not on file  Stress: Not on file  Social Connections: Not on file  Intimate Partner Violence: Not on file    Outpatient Medications Prior to Visit  Medication Sig Dispense Refill   FLUoxetine (PROZAC) 10 MG capsule Take 1 capsule (10 mg total) by mouth daily. (Patient not taking: Reported on 04/30/2021) 30 capsule 2   hydrOXYzine (ATARAX/VISTARIL) 25 MG tablet Take 1 tablet (25 mg total) by mouth 3 (three) times daily as needed for anxiety. (Patient not taking: Reported on 04/30/2021) 90 tablet 2   sulfamethoxazole-trimethoprim (BACTRIM DS) 800-160 MG tablet Take 1 tablet by mouth 2 (two) times daily. (Patient not taking: Reported on 04/30/2021) 14 tablet 0   traZODone (DESYREL) 50 MG tablet Take 1 tablet (50 mg total) by mouth at bedtime as needed for sleep. (Patient not taking: Reported on 04/30/2021) 30 tablet 2   No facility-administered medications prior to visit.    No Known Allergies  Review of Systems  Constitutional:  Negative for chills, fever and malaise/fatigue.  HENT: Negative.    Eyes: Negative.   Respiratory:  Negative for cough  and shortness of breath.   Cardiovascular:  Negative for chest pain, palpitations and leg swelling.  Gastrointestinal:  Negative for abdominal pain, blood in stool, constipation, diarrhea, nausea and vomiting.  Musculoskeletal: Negative.   Skin: Negative.   Neurological: Negative.   Psychiatric/Behavioral:  Negative for depression. The patient is not nervous/anxious.   All other systems reviewed and are negative.     Objective:    Physical Exam Vitals reviewed.  Constitutional:      General: She is not in acute distress.    Appearance: Normal appearance.  HENT:     Head:  Normocephalic.     Right Ear: Tympanic membrane, ear canal and external ear normal.     Left Ear: Tympanic membrane, ear canal and external ear normal.     Nose: Nose normal.     Mouth/Throat:     Mouth: Mucous membranes are moist.  Eyes:     Extraocular Movements: Extraocular movements intact.     Conjunctiva/sclera: Conjunctivae normal.     Pupils: Pupils are equal, round, and reactive to light.  Cardiovascular:     Rate and Rhythm: Normal rate and regular rhythm.     Pulses: Normal pulses.     Heart sounds: Normal heart sounds.     Comments: No obvious peripheral edema Pulmonary:     Effort: Pulmonary effort is normal.     Breath sounds: Normal breath sounds.  Musculoskeletal:        General: Normal range of motion.  Skin:    General: Skin is warm and dry.     Capillary Refill: Capillary refill takes less than 2 seconds.  Neurological:     General: No focal deficit present.     Mental Status: She is alert and oriented to person, place, and time.  Psychiatric:        Mood and Affect: Mood normal.        Behavior: Behavior normal.        Thought Content: Thought content normal.        Judgment: Judgment normal.    BP 99/69 (BP Location: Left Arm)   Pulse 64   Temp (!) 97.5 F (36.4 C)   Ht 5' 2.1" (1.577 m)   Wt 129 lb 0.2 oz (58.5 kg)   LMP 03/30/2021   SpO2 100%   BMI 23.52 kg/m  Wt Readings from Last 3 Encounters:  04/30/21 129 lb 0.2 oz (58.5 kg)  04/09/21 128 lb 3.2 oz (58.2 kg)  02/08/21 129 lb (58.5 kg)    Immunization History  Administered Date(s) Administered   Moderna Sars-Covid-2 Vaccination 06/11/2020, 06/21/2020, 07/26/2020    Diabetic Foot Exam - Simple   No data filed     Lab Results  Component Value Date   TSH 1.050 04/20/2021   Lab Results  Component Value Date   WBC 5.7 04/20/2021   HGB 13.5 04/20/2021   HCT 39.6 04/20/2021   MCV 93 04/20/2021   PLT 208 04/20/2021   Lab Results  Component Value Date   NA 139 03/27/2021   K  3.6 03/27/2021   CO2 22 03/27/2021   GLUCOSE 84 03/27/2021   BUN 10 03/27/2021   CREATININE 0.62 03/27/2021   BILITOT 0.6 03/27/2021   ALKPHOS 64 03/27/2021   AST 16 03/27/2021   ALT 13 03/27/2021   PROT 7.5 03/27/2021   ALBUMIN 4.8 03/27/2021   CALCIUM 9.5 03/27/2021   EGFR 126 03/27/2021   No results found for: CHOL No results found for:  HDL No results found for: LDLCALC No results found for: TRIG No results found for: CHOLHDL No results found for: HGBA1C     Assessment & Plan:   Problem List Items Addressed This Visit   None Visit Diagnoses     Healthcare maintenance    -  Primary   Relevant Orders   POCT URINALYSIS DIP (CLINITEK) (Completed)   Lipid panel Encouraged continued abstinence from harmful substances  Discussed diet and exercises options Encouraged to maintain follow ups with psychiatrist and counselor     Follow up in 1 mth for Pap smear     I have discontinued Pearla Rotan's sulfamethoxazole-trimethoprim, FLUoxetine, hydrOXYzine, and traZODone.  No orders of the defined types were placed in this encounter.    Teena Dunk, NP

## 2021-04-30 NOTE — Patient Instructions (Signed)
You were seen today in the [facility] to establish care. Labs were collected, results will be available via MyChart or, if abnormal, you will be contacted by clinic staff. You were prescribed medications, please take as directed. Please follow up in 1 mth for reevaluation.  

## 2021-05-01 LAB — LIPID PANEL
Chol/HDL Ratio: 3 ratio (ref 0.0–4.4)
Cholesterol, Total: 138 mg/dL (ref 100–199)
HDL: 46 mg/dL (ref >39)
LDL Chol Calc (NIH): 72 mg/dL (ref 0–99)
Triglycerides: 112 mg/dL (ref 0–149)
VLDL Cholesterol Cal: 20 mg/dL (ref 5–40)

## 2021-05-18 ENCOUNTER — Ambulatory Visit
Admission: RE | Admit: 2021-05-18 | Discharge: 2021-05-18 | Disposition: A | Discharge status: Home / Self Care | Payer: Self-pay | Source: Ambulatory Visit | Attending: Emergency Medicine | Admitting: Emergency Medicine

## 2021-05-18 ENCOUNTER — Other Ambulatory Visit: Payer: Self-pay

## 2021-05-18 VITALS — BP 109/73 | HR 97 | Temp 98.0°F | Resp 18

## 2021-05-18 DIAGNOSIS — M545 Low back pain, unspecified: Secondary | ICD-10-CM

## 2021-05-18 DIAGNOSIS — G8929 Other chronic pain: Secondary | ICD-10-CM

## 2021-05-18 MED ORDER — IBUPROFEN 600 MG PO TABS
600.0000 mg | ORAL_TABLET | Freq: Four times a day (QID) | ORAL | 0 refills | Status: DC | PRN
  Filled 2021-05-18: qty 30, 8d supply, fill #0

## 2021-05-18 MED ORDER — METHOCARBAMOL 500 MG PO TABS
500.0000 mg | ORAL_TABLET | Freq: Two times a day (BID) | ORAL | 0 refills | Status: DC | PRN
  Filled 2021-05-18: qty 10, 5d supply, fill #0

## 2021-05-18 NOTE — Discharge Instructions (Addendum)
Take ibuprofen as needed for discomfort.  Take the muscle relaxer as needed for muscle spasm; Do not drive, operate machinery, or drink alcohol with this medication as it can cause drowsiness.   Follow up with your primary care provider or an orthopedist if your symptoms are not improving.     

## 2021-05-18 NOTE — ED Provider Notes (Signed)
[facility]    CSN: [id number] Arrival date & time: 05/18/21  1150      History   Chief Complaint Chief Complaint  Patient presents with   Back Pain     HPI Lori Ponce is a 25 y.o. female.  Patient presents with exacerbation of chronic low back pain.  She states she has had chronic low back pain for 5 years but it has been worse over the past couple of weeks due to standing and walking for prolonged periods at work.  The low back pain is bilateral and nonradiating.  No treatments attempted at home.  No falls or injury.  She denies numbness, weakness, paresthesias, saddle anesthesia, loss of bowel/bladder control, abdominal pain, dysuria, hematuria, fever, or other symptoms.  Her medical history includes anxiety, depression, alcohol use disorder in remission.  The history is provided by the patient and medical records.   History reviewed. No pertinent past medical history.  Patient Active Problem List   Diagnosis Date Noted   Alcohol use disorder, mild, in early remission 04/09/2021   Alcohol use disorder, severe, in early remission, dependence (HCC) 02/08/2021   COVID-19 virus infection 08/28/2020   Major depressive disorder, recurrent episode with anxious distress (HCC) 06/07/2020   Recurrent moderate major depressive disorder with anxiety (HCC) 05/15/2020   Anxiety disorder 05/15/2020   Influenza vaccination declined 05/15/2020   COVID-19 vaccination declined 05/15/2020    Past Surgical History:  Procedure Laterality Date   NO PAST SURGERIES      OB History   No obstetric history on file.      Home Medications    Prior to Admission medications   Medication Sig Start Date End Date Taking? Authorizing Provider  ibuprofen (ADVIL) 600 MG tablet Take 1 tablet (600 mg total) by mouth every 6 (six) hours as needed. 05/18/21  Yes Mickie Bail, NP  methocarbamol (ROBAXIN) 500 MG tablet Take 1 tablet (500 mg total) by mouth 2 (two) times daily as  needed for muscle spasms. 05/18/21  Yes Mickie Bail, NP  escitalopram (LEXAPRO) 20 MG tablet Take 1 tablet (20 mg total) by mouth daily. 07/17/20 09/28/20  Marcine Matar, MD    Family History Family History  Problem Relation Age of Onset   Diabetes Maternal Grandmother    Diabetes Maternal Grandfather     Social History Social History   Tobacco Use   Smoking status: Never   Smokeless tobacco: Never  Vaping Use   Vaping Use: Never used  Substance Use Topics   Alcohol use: Yes    Comment: occasionally   Drug use: Yes    Frequency: 2.0 times per week    Types: Marijuana     Allergies   Patient has no known allergies.   Review of Systems Review of Systems  Constitutional:  Negative for chills and fever.  Respiratory:  Negative for cough and shortness of breath.   Cardiovascular:  Negative for chest pain and palpitations.  Gastrointestinal:  Negative for abdominal pain and vomiting.  Genitourinary:  Negative for dysuria and hematuria.  Musculoskeletal:  Positive for back pain. Negative for gait problem.  Skin:  Negative for color change and rash.  Neurological:  Negative for weakness and numbness.  All other systems reviewed and are negative.   Physical Exam Triage Vital Signs ED Triage Vitals  Enc Vitals Group     BP      Pulse      Resp  Temp      Temp src      SpO2      Weight      Height      Head Circumference      Peak Flow      Pain Score      Pain Loc      Pain Edu?      Excl. in GC?    No data found.  Updated Vital Signs BP 109/73 (BP Location: Left Arm)   Pulse 97   Temp 98 F (36.7 C) (Oral)   Resp 18   LMP  (LMP Unknown)   SpO2 97%   Visual Acuity Right Eye Distance:   Left Eye Distance:   Bilateral Distance:    Right Eye Near:   Left Eye Near:    Bilateral Near:     Physical Exam Vitals and nursing note reviewed.  Constitutional:      General: She is not in acute distress.    Appearance: She is well-developed. She  is not ill-appearing.  HENT:     Head: Normocephalic and atraumatic.     Mouth/Throat:     Mouth: Mucous membranes are moist.  Eyes:     Conjunctiva/sclera: Conjunctivae normal.  Cardiovascular:     Rate and Rhythm: Normal rate and regular rhythm.     Heart sounds: Normal heart sounds.  Pulmonary:     Effort: Pulmonary effort is normal. No respiratory distress.     Breath sounds: Normal breath sounds.  Abdominal:     Palpations: Abdomen is soft.     Tenderness: There is no abdominal tenderness. There is no right CVA tenderness, left CVA tenderness, guarding or rebound.  Musculoskeletal:        General: No swelling, tenderness, deformity or signs of injury. Normal range of motion.     Cervical back: Neck supple.  Skin:    General: Skin is warm and dry.     Capillary Refill: Capillary refill takes less than 2 seconds.     Findings: No bruising, erythema, lesion or rash.  Neurological:     General: No focal deficit present.     Mental Status: She is alert and oriented to person, place, and time.     Sensory: No sensory deficit.     Motor: No weakness.     Gait: Gait normal.  Psychiatric:        Mood and Affect: Mood normal.        Behavior: Behavior normal.     UC Treatments / Results  Labs (all labs ordered are listed, but only abnormal results are displayed) Labs Reviewed - No data to display  EKG   Radiology No results found.  Procedures Procedures (including critical care time)  Medications Ordered in UC Medications - No data to display  Initial Impression / Assessment and Plan / UC Course  I have reviewed the triage vital signs and the nursing notes.  Pertinent labs & imaging results that were available during my care of the patient were reviewed by me and considered in my medical decision making (see chart for details).  Chronic low back pain without sciatica.  Work note provided per patient request.  Treating with ibuprofen and methocarbamol.  Precautions  for drowsiness with methocarbamol discussed.  Instructed patient to follow-up with her PCP or an orthopedist if her symptoms are not improving.  Education provided on chronic back pain.  Patient agrees to plan of care.   Final Clinical Impressions(s) /  UC Diagnoses   Final diagnoses:  Chronic bilateral low back pain without sciatica     Discharge Instructions      Take ibuprofen as needed for discomfort.  Take the muscle relaxer as needed for muscle spasm; Do not drive, operate machinery, or drink alcohol with this medication as it can cause drowsiness.   Follow up with your primary care provider or an orthopedist if your symptoms are not improving.         ED Prescriptions     Medication Sig Dispense Auth. Provider   ibuprofen (ADVIL) 600 MG tablet Take 1 tablet (600 mg total) by mouth every 6 (six) hours as needed. 30 tablet Mickie Bail, NP   methocarbamol (ROBAXIN) 500 MG tablet Take 1 tablet (500 mg total) by mouth 2 (two) times daily as needed for muscle spasms. 10 tablet Mickie Bail, NP      I have reviewed the PDMP during this encounter.   Mickie Bail, NP 05/18/21 1213

## 2021-05-18 NOTE — ED Triage Notes (Signed)
Pt has a 5 year history of back pain. Over the past few weeks her back pain has been getting worse.

## 2021-05-30 ENCOUNTER — Ambulatory Visit: Payer: Medicaid Other | Admitting: Nurse Practitioner

## 2021-07-11 ENCOUNTER — Ambulatory Visit
Admission: EM | Admit: 2021-07-11 | Discharge: 2021-07-11 | Disposition: A | Discharge status: Home / Self Care | Payer: Medicaid Other | Attending: Physician Assistant | Admitting: Physician Assistant

## 2021-07-11 ENCOUNTER — Other Ambulatory Visit: Payer: Self-pay

## 2021-07-11 ENCOUNTER — Encounter: Payer: Self-pay | Admitting: Emergency Medicine

## 2021-07-11 DIAGNOSIS — R110 Nausea: Secondary | ICD-10-CM

## 2021-07-11 DIAGNOSIS — M791 Myalgia, unspecified site: Secondary | ICD-10-CM

## 2021-07-11 NOTE — ED Provider Notes (Signed)
[facility]    CSN: [id number] Arrival date & time: 07/11/21  1007      History   Chief Complaint Chief Complaint  Patient presents with   Nausea   Generalized Body Aches    HPI Lori Ponce is a 25 y.o. female.   Patient here today for evaluation of nausea and generalized body aches that started this morning.  She has not had any other symptoms and denies cough, fever, runny nose, sore throat or diarrhea.  She does not report any medication for treatment.  The history is provided by the patient.   History reviewed. No pertinent past medical history.  Patient Active Problem List   Diagnosis Date Noted   Alcohol use disorder, mild, in early remission 04/09/2021   Alcohol use disorder, severe, in early remission, dependence ([place]) 02/08/2021   COVID-19 virus infection 08/28/2020   Major depressive disorder, recurrent episode with anxious distress ([place]) 06/07/2020   Recurrent moderate major depressive disorder with anxiety ([place]) 05/15/2020   Anxiety disorder 05/15/2020   Influenza vaccination declined 05/15/2020   COVID-19 vaccination declined 05/15/2020    Past Surgical History:  Procedure Laterality Date   NO PAST SURGERIES      OB History   No obstetric history on file.      Home Medications    Prior to Admission medications   Medication Sig Start Date End Date Taking? Authorizing Provider  ibuprofen (ADVIL) 600 MG tablet Take 1 tablet (600 mg total) by mouth every 6 (six) hours as needed. 05/18/21   Sharion Balloon, NP  methocarbamol (ROBAXIN) 500 MG tablet Take 1 tablet (500 mg total) by mouth 2 (two) times daily as needed for muscle spasms. 05/18/21   Sharion Balloon, NP  escitalopram (LEXAPRO) 20 MG tablet Take 1 tablet (20 mg total) by mouth daily. 07/17/20 09/28/20  Ladell Pier, MD    Family History Family History  Problem Relation Age of Onset   Diabetes Maternal Grandmother    Diabetes Maternal Grandfather     Social  History Social History   Tobacco Use   Smoking status: Never   Smokeless tobacco: Never  Vaping Use   Vaping Use: Never used  Substance Use Topics   Alcohol use: Yes    Comment: occasionally   Drug use: Yes    Frequency: 2.0 times per week    Types: Marijuana     Allergies   Patient has no known allergies.   Review of Systems Review of Systems  Constitutional:  Negative for chills and fever.  HENT:  Negative for congestion and sore throat.   Eyes:  Negative for discharge and redness.  Respiratory:  Negative for cough and shortness of breath.   Gastrointestinal:  Positive for nausea. Negative for abdominal pain, diarrhea and vomiting.  Genitourinary:  Positive for vaginal bleeding and vaginal discharge.  Musculoskeletal:  Positive for myalgias.    Physical Exam Triage Vital Signs ED Triage Vitals [07/11/21 1117]  Enc Vitals Group     BP 112/69     Pulse Rate 68     Resp 16     Temp 98.4 F (36.9 C)     Temp Source Oral     SpO2 98 %     Weight      Height      Head Circumference      Peak Flow      Pain Score 8     Pain Loc      Pain  Edu?      Excl. in GC?    No data found.  Updated Vital Signs BP 112/69 (BP Location: Right Arm)   Pulse 68   Temp 98.4 F (36.9 C) (Oral)   Resp 16   SpO2 98%   Physical Exam Vitals and nursing note reviewed.  Constitutional:      General: She is not in acute distress.    Appearance: Normal appearance. She is not ill-appearing.  HENT:     Head: Normocephalic and atraumatic.  Eyes:     Conjunctiva/sclera: Conjunctivae normal.  Cardiovascular:     Rate and Rhythm: Normal rate and regular rhythm.     Heart sounds: Normal heart sounds. No murmur heard. Pulmonary:     Effort: Pulmonary effort is normal. No respiratory distress.     Breath sounds: Normal breath sounds. No wheezing, rhonchi or rales.  Neurological:     Mental Status: She is alert.  Psychiatric:        Mood and Affect: Mood normal.        Behavior:  Behavior normal.        Thought Content: Thought content normal.     UC Treatments / Results  Labs (all labs ordered are listed, but only abnormal results are displayed) Labs Reviewed  COVID-19, FLU A+B NAA    EKG   Radiology No results found.  Procedures Procedures (including critical care time)  Medications Ordered in UC Medications - No data to display  Initial Impression / Assessment and Plan / UC Course  I have reviewed the triage vital signs and the nursing notes.  Pertinent labs & imaging results that were available during my care of the patient were reviewed by me and considered in my medical decision making (see chart for details).  Unknown etiology of symptoms however given body aches suspect likely viral etiology.  Will screen for COVID and flu.  Recommend increase fluids and rest.  Will await results for further recommendation.  Final Clinical Impressions(s) / UC Diagnoses   Final diagnoses:  Myalgia  Nausea without vomiting   Discharge Instructions   None    ED Prescriptions   None    PDMP not reviewed this encounter.   Tomi Bamberger, PA-C 07/11/21 1239

## 2021-07-11 NOTE — ED Triage Notes (Signed)
Woke up this morning nauseous with generalized body aches. Denies cough, diarrhea, fever, runny nose, sore throat. Her significant other recently had tonsillitis.

## 2021-07-12 ENCOUNTER — Other Ambulatory Visit: Payer: Self-pay | Admitting: Emergency Medicine

## 2021-07-12 LAB — COVID-19, FLU A+B NAA
Influenza A, NAA: NOT DETECTED
Influenza B, NAA: NOT DETECTED
SARS-CoV-2, NAA: NOT DETECTED

## 2021-07-16 ENCOUNTER — Encounter (HOSPITAL_COMMUNITY): Payer: No Payment, Other | Admitting: Psychiatry

## 2021-07-16 ENCOUNTER — Other Ambulatory Visit: Payer: Self-pay

## 2021-07-19 ENCOUNTER — Other Ambulatory Visit: Payer: Self-pay

## 2021-07-19 ENCOUNTER — Ambulatory Visit: Payer: Self-pay | Attending: Critical Care Medicine | Admitting: Critical Care Medicine

## 2021-07-19 ENCOUNTER — Encounter: Payer: Self-pay | Admitting: Critical Care Medicine

## 2021-07-19 VITALS — BP 104/69 | HR 96 | Temp 100.3°F | Resp 16 | Wt 128.4 lb

## 2021-07-19 DIAGNOSIS — J101 Influenza due to other identified influenza virus with other respiratory manifestations: Secondary | ICD-10-CM

## 2021-07-19 DIAGNOSIS — J069 Acute upper respiratory infection, unspecified: Secondary | ICD-10-CM

## 2021-07-19 HISTORY — DX: Influenza due to other identified influenza virus with other respiratory manifestations: J10.1

## 2021-07-19 LAB — POCT INFLUENZA A/B
Influenza A, POC: POSITIVE — AB
Influenza B, POC: NEGATIVE

## 2021-07-19 MED ORDER — METHOCARBAMOL 500 MG PO TABS
500.0000 mg | ORAL_TABLET | Freq: Two times a day (BID) | ORAL | 0 refills | Status: DC | PRN
  Filled 2021-07-19: qty 10, 5d supply, fill #0

## 2021-07-19 MED ORDER — BENZONATATE 100 MG PO CAPS
100.0000 mg | ORAL_CAPSULE | Freq: Two times a day (BID) | ORAL | 0 refills | Status: DC | PRN
  Filled 2021-07-19: qty 30, 15d supply, fill #0

## 2021-07-19 MED ORDER — OSELTAMIVIR PHOSPHATE 75 MG PO CAPS
75.0000 mg | ORAL_CAPSULE | Freq: Two times a day (BID) | ORAL | 0 refills | Status: DC
  Filled 2021-07-19: qty 10, 5d supply, fill #0

## 2021-07-19 MED ORDER — IBUPROFEN 600 MG PO TABS
600.0000 mg | ORAL_TABLET | Freq: Four times a day (QID) | ORAL | 0 refills | Status: DC | PRN
  Filled 2021-07-19: qty 30, 8d supply, fill #0

## 2021-07-19 NOTE — Patient Instructions (Addendum)
Covid and Flu test done  You have influenza A   we are awaiting covid  we have seen both at same time recently, will call result  Ibuprofen and robaxin refilled  Will order Tamiflu  Drink plenty of fluids   Also use tylenol as needed for pain and fever  Start Vitamin C 500mg  OTC daily  Start Zinc 50mg  Daily   Use both supplements above for one week and then can stop   A video visit with Dr will occur next week  Stay in isolation for now until covid test returns.  Let roommates know you have Flu and stay isolated until no longer have fever without use of anti fever meds : ibuprofen or tylenol
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
## 2021-07-19 NOTE — Assessment & Plan Note (Signed)
Acute influenza A in a 25 year old woman who does not have significant comorbidities  No evidence of pneumonia hypoxemia at this time  Note the patient did not receive a flu vaccine this season she did receive 2 COVID vaccinations in 2021 but not boosted  Plan is to administer Tamiflu for a 5-day course 75 mg twice daily, she was given supportive care with ibuprofen and Robaxin and benzonatate for cough suppression encouraged to stay home stay in isolation.  She can come out of isolation when she has had no fever for 24 hours off antipyretics.  She has roommates in her apartment she is told to stay away from the roommates and notify them that she has influenza A.  Patient was given a work excuse to be out of work for 1 week

## 2021-07-19 NOTE — Progress Notes (Signed)
Patient also stated that she has been having a fever,body ache,sore throat, and coughing. Temp. Running at 100.3 and no vomiting.

## 2021-07-20 ENCOUNTER — Telehealth: Payer: Self-pay

## 2021-07-20 LAB — NOVEL CORONAVIRUS, NAA: SARS-CoV-2, NAA: NOT DETECTED

## 2021-07-20 NOTE — Telephone Encounter (Signed)
-----   Message from Storm Frisk, MD sent at 07/20/2021  1:39 PM EST ----- Call the pt to tell her she only has flu. Covid is NEGATIVE

## 2021-07-20 NOTE — Telephone Encounter (Signed)
Pt was called and is aware of results, DOB was confirmed.  ?

## 2021-07-20 NOTE — Progress Notes (Signed)
Call the pt to tell her she only has flu. Covid is NEGATIVE

## 2021-07-25 ENCOUNTER — Ambulatory Visit: Payer: Self-pay | Attending: Critical Care Medicine | Admitting: Critical Care Medicine

## 2021-07-25 ENCOUNTER — Other Ambulatory Visit: Payer: Self-pay

## 2021-07-25 DIAGNOSIS — J101 Influenza due to other identified influenza virus with other respiratory manifestations: Secondary | ICD-10-CM

## 2021-07-25 NOTE — Progress Notes (Signed)
Established Patient Office Visit  Subjective:  Patient ID: Lori Ponce, female    DOB: [birth year]  Age: 25 y.o. MRN: [id number] Virtual Visit via Telephone Note  I connected with Lori Ponce on 07/26/21 at 11:00 AM EST by telephone and verified that I am speaking with the correct person using two identifiers.   Consent:  I discussed the limitations, risks, security and privacy concerns of performing an evaluation and management service by telephone and the availability of in person appointments. I also discussed with the patient that there may be a patient responsible charge related to this service. The patient expressed understanding and agreed to proceed.  Location of patient: Patient's at home  Location of provider: I am in my office  Persons participating in the televisit with the patient.    No one else on the call   History of Present Illness:   CC: Follow-up of flu   HPI Lori Ponce presents for follow-up of influenza A illness.  Patient was seen in the office a week ago diagnosed with influenza A did receive her Tamiflu and took the full course of therapy.  She is completely resolved her entire symptom complex.  She is now back to work.  She has no residual issues except for a mild cough which the benzonatate is helping with.  Past Medical History:  Diagnosis Date   Influenza A 07/19/2021    Past Surgical History:  Procedure Laterality Date   NO PAST SURGERIES      Family History  Problem Relation Age of Onset   Diabetes Maternal Grandmother    Diabetes Maternal Grandfather     Social History   Socioeconomic History   Marital status: Single    Spouse name: Not on file   Number of children: 1   Years of education: 10 th grade   Highest education level: Not on file  Occupational History   Not on file  Tobacco Use   Smoking status: Never   Smokeless tobacco: Never  Vaping Use   Vaping Use: Never used  Substance and Sexual Activity    Alcohol use: Yes    Comment: occasionally   Drug use: Yes    Frequency: 2.0 times per week    Types: Marijuana   Sexual activity: Yes    Birth control/protection: None  Other Topics Concern   Not on file  Social History Narrative   Not on file   Social Determinants of Health   Financial Resource Strain: Not on file  Food Insecurity: Not on file  Transportation Needs: Not on file  Physical Activity: Not on file  Stress: Not on file  Social Connections: Not on file  Intimate Partner Violence: Not on file    Outpatient Medications Prior to Visit  Medication Sig Dispense Refill   benzonatate (TESSALON) 100 MG capsule Take 1 capsule (100 mg total) by mouth 2 (two) times daily as needed for cough. 30 capsule 0   ibuprofen (ADVIL) 600 MG tablet Take 1 tablet (600 mg total) by mouth every 6 (six) hours as needed. 30 tablet 0   methocarbamol (ROBAXIN) 500 MG tablet Take 1 tablet (500 mg total) by mouth 2 (two) times daily as needed for muscle spasms. 10 tablet 0   oseltamivir (TAMIFLU) 75 MG capsule Take 1 capsule (75 mg total) by mouth 2 (two) times daily. 10 capsule 0   No facility-administered medications prior to visit.    No Known Allergies  ROS Review of Systems  Constitutional:  Negative for chills, diaphoresis and fever.  HENT:  Negative for congestion, hearing loss, nosebleeds, sore throat and tinnitus.   Eyes:  Negative for photophobia and redness.  Respiratory:  Positive for cough. Negative for shortness of breath, wheezing and stridor.   Cardiovascular:  Negative for chest pain, palpitations and leg swelling.  Gastrointestinal:  Negative for abdominal pain, blood in stool, constipation, diarrhea, nausea and vomiting.  Endocrine: Negative for polydipsia.  Genitourinary:  Negative for dysuria, flank pain, frequency, hematuria and urgency.  Musculoskeletal:  Negative for back pain, myalgias and neck pain.  Skin:  Negative for rash.  Allergic/Immunologic: Negative for  environmental allergies.  Neurological:  Negative for dizziness, tremors, seizures, weakness and headaches.  Hematological:  Does not bruise/bleed easily.  Psychiatric/Behavioral:  Negative for suicidal ideas. The patient is not nervous/anxious.      Objective:    Physical Exam No exam this is a telephone visit There were no vitals taken for this visit. Wt Readings from Last 3 Encounters:  07/19/21 128 lb 6.4 oz (58.2 kg)  04/30/21 129 lb 0.2 oz (58.5 kg)  07/17/20 114 lb 9.6 oz (52 kg)     Health Maintenance Due  Topic Date Due   Pneumococcal Vaccine 77-83 Years old (1 - PCV) Never done   COVID-19 Vaccine (3 - Booster for Moderna series) 09/20/2020    There are no preventive care reminders to display for this patient.  Lab Results  Component Value Date   TSH 1.050 04/20/2021   Lab Results  Component Value Date   WBC 5.7 04/20/2021   HGB 13.5 04/20/2021   HCT 39.6 04/20/2021   MCV 93 04/20/2021   PLT 208 04/20/2021   Lab Results  Component Value Date   NA 139 03/27/2021   K 3.6 03/27/2021   CO2 22 03/27/2021   GLUCOSE 84 03/27/2021   BUN 10 03/27/2021   CREATININE 0.62 03/27/2021   BILITOT 0.6 03/27/2021   ALKPHOS 64 03/27/2021   AST 16 03/27/2021   ALT 13 03/27/2021   PROT 7.5 03/27/2021   ALBUMIN 4.8 03/27/2021   CALCIUM 9.5 03/27/2021   EGFR 126 03/27/2021   Lab Results  Component Value Date   CHOL 138 04/30/2021   Lab Results  Component Value Date   HDL 46 04/30/2021   Lab Results  Component Value Date   LDLCALC 72 04/30/2021   Lab Results  Component Value Date   TRIG 112 04/30/2021   Lab Results  Component Value Date   CHOLHDL 3.0 04/30/2021   No results found for: HGBA1C    Assessment & Plan:   Problem List Items Addressed This Visit       Respiratory   RESOLVED: Influenza A    Influenza A this has resolved patient is back at work       No orders of the defined types were placed in this encounter.  Follow Up  Instructions: Patient instructed follow-up with her primary care as needed   I discussed the assessment and treatment plan with the patient. The patient was provided an opportunity to ask questions and all were answered. The patient agreed with the plan and demonstrated an understanding of the instructions.   The patient was advised to call back or seek an in-person evaluation if the symptoms worsen or if the condition fails to improve as anticipated.  I provided 10 minutes of non-face-to-face time during this encounter  including  median intraservice time , review of notes, labs, imaging, medications  and  explaining diagnosis and management to the patient .    Asencion Noble, MD

## 2021-07-26 ENCOUNTER — Encounter: Payer: Self-pay | Admitting: Critical Care Medicine

## 2021-07-26 NOTE — Assessment & Plan Note (Signed)
Influenza A this has resolved patient is back at work

## 2021-08-23 ENCOUNTER — Ambulatory Visit: Payer: Self-pay | Attending: Critical Care Medicine

## 2021-08-23 ENCOUNTER — Other Ambulatory Visit: Payer: Self-pay

## 2021-09-07 ENCOUNTER — Other Ambulatory Visit: Payer: Self-pay

## 2021-09-07 ENCOUNTER — Encounter (HOSPITAL_COMMUNITY): Payer: Self-pay

## 2021-09-07 ENCOUNTER — Ambulatory Visit (HOSPITAL_COMMUNITY)
Admission: EM | Admit: 2021-09-07 | Discharge: 2021-09-07 | Disposition: A | Discharge status: Home / Self Care | Payer: Medicaid Other | Attending: Physician Assistant | Admitting: Physician Assistant

## 2021-09-07 DIAGNOSIS — T6591XA Toxic effect of unspecified substance, accidental (unintentional), initial encounter: Secondary | ICD-10-CM

## 2021-09-07 DIAGNOSIS — R1010 Upper abdominal pain, unspecified: Secondary | ICD-10-CM

## 2021-09-07 MED ORDER — SUCRALFATE 1 G PO TABS
1.0000 g | ORAL_TABLET | Freq: Three times a day (TID) | ORAL | 0 refills | Status: DC
  Filled 2021-09-07: qty 12, 3d supply, fill #0

## 2021-09-07 NOTE — ED Notes (Signed)
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   Expressed pt may experience n/v and diarrhea down the line. Per Poison Control this situation does not require medical attention.  Stated pt should be given something to drink and should keep drinking fluids at home. Per PC if severe abd pain developed and persistent V/D and blistering of the mouth and throat she should seek medical attention and report to ED   Per PC if pt is in deep sedation she should seek immediate medical attention  Phone call reported to [place], [place].

## 2021-09-07 NOTE — ED Notes (Signed)
Pt given Ginger ale.

## 2021-09-07 NOTE — Discharge Instructions (Signed)
As we discussed, you do not need to go to the emergency room at this time but if at any point anything worsens you must go to the ER.  Use Carafate to help coat your stomach.  Avoid spicy/acidic/fatty foods.  Make sure you are drinking plenty of fluid.  Follow-up with either our clinic or your primary care within a few days to ensure symptom improvement.  If at any point anything changes or worsens and you have worsening abdominal pain, vomiting, difficulty swallowing, drooling, voice change, severe sore throat, blood in your stool, dark stools you must go to the ER as we discussed.

## 2021-09-07 NOTE — ED Triage Notes (Signed)
Pt presents with c/o feeling nauseas after drinking a "gulp" of Greased Lightning. Pt states it happened around 0815 this morning.   States she drank ginger ale after.

## 2021-09-07 NOTE — ED Provider Notes (Signed)
[facility]    CSN: [id number] Arrival date & time: 09/07/21  [id number]      History   Chief Complaint Chief Complaint  Patient presents with   Chemical Exposure    HPI Lori Ponce is a 26 y.o. female.   Patient presents today with an accidental ingestion of possible chemical known as grease lightening.  Reports that her mother had given her a small amount in a water bottle to help clean her bathroom and she mistook this for water to take her pills this morning.  Reports she drank approximately 1 large cup or 15 mL.  This occurred at approximately 815 this morning and she immediately presented to our clinic.  She has had some nausea and abdominal pain but denies any significant sore throat, dermal burns, vomiting, bloody or dark stools.  She has been able to drink ginger ale and fluid since the episode without difficulty.  She does report abdominal pain is rated 3 on a 0-10 pain scale, localized to upper abdomen, described as a burning sensation, no alleviating factors identified.  She is otherwise healthy and does not take medication on a regular basis.  She reports this was an accidental exposure denies any thoughts of suicide/self-harm or suicidal ideation.   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      Home Medications    Prior to Admission medications   Medication Sig Start Date End Date Taking? Authorizing Provider  sucralfate (CARAFATE) 1 g tablet Take 1 tablet (1 g  total) by mouth 4 (four) times daily -  with meals and at bedtime. 09/07/21  Yes Linnie Mcglocklin K, PA-C  benzonatate (TESSALON) 100 MG capsule Take 1 capsule (100 mg total) by mouth 2 (two) times daily as needed for cough. 07/19/21   Elsie Stain, MD  ibuprofen (ADVIL) 600 MG tablet Take 1 tablet (600 mg total) by mouth every 6 (six) hours as needed. 07/19/21   Elsie Stain, MD  methocarbamol (ROBAXIN) 500 MG tablet Take 1 tablet (500 mg total) by mouth 2 (two) times daily as needed for muscle spasms. 07/19/21   Elsie Stain, MD  escitalopram (LEXAPRO) 20 MG tablet Take 1 tablet (20 mg total) by mouth daily. 07/17/20 09/28/20  Ladell Pier, MD    Family History Family History  Problem Relation Age of Onset   Diabetes Maternal Grandmother    Diabetes Maternal Grandfather     Social History Social History   Tobacco Use   Smoking status: Never   Smokeless tobacco: Never  Vaping Use   Vaping Use: Never used  Substance Use Topics   Alcohol use: Yes    Comment: occasionally   Drug use: Yes    Frequency: 2.0 times per week    Types: Marijuana     Allergies   Patient has no known allergies.   Review of Systems Review of Systems  Constitutional:  Negative for activity change, appetite change, fatigue and fever.  HENT:  Negative for sore throat, trouble swallowing and voice change.   Respiratory:  Negative for cough and shortness of breath.   Cardiovascular:  Negative for chest pain.  Gastrointestinal:  Positive for abdominal pain and nausea. Negative for diarrhea and vomiting.  Neurological:  Negative for dizziness, light-headedness and headaches.    Physical Exam Triage Vital Signs ED Triage Vitals  Enc Vitals Group     BP 09/07/21 0948 112/72     Pulse Rate 09/07/21 0948 65     Resp 09/07/21 0948 17     Temp 09/07/21 0948 98.2 F (36.8 C)     Temp Source 09/07/21 0948 Oral     SpO2 09/07/21 0948 99 %     Weight --      Height --      Head Circumference  --      Peak Flow --      Pain Score 09/07/21 0946 4     Pain Loc --      Pain Edu? --      Excl. in [place]? --    No data found.  Updated Vital Signs BP 112/72 (BP Location: Left Arm)    Pulse 65    Temp 98.2 F (36.8 C) (Oral)    Resp 17    LMP 08/20/2021 (Approximate)    SpO2 99%   Visual Acuity Right Eye Distance:   Left Eye Distance:   Bilateral Distance:    Right Eye Near:   Left Eye Near:    Bilateral Near:     Physical Exam Vitals reviewed.  Constitutional:      General: She is awake. She is not in acute distress.    Appearance: Normal appearance. She is well-developed. She is not ill-appearing.     Comments: Very pleasant female appears stated age in no acute distress sitting comfortably on exam room table  HENT:     Head: Normocephalic and atraumatic.     Mouth/Throat:     Pharynx: Uvula midline. Posterior oropharyngeal erythema present. No oropharyngeal exudate.     Comments: Mild erythema in posterior oropharynx.  No significant vesicles or swelling. Cardiovascular:     Rate and Rhythm: Normal rate and regular rhythm.     Heart sounds: Normal heart sounds, S1 normal and S2 normal. No murmur heard. Pulmonary:     Effort: Pulmonary effort is normal.     Breath sounds: Normal breath sounds. No wheezing, rhonchi or rales.     Comments: Clear to auscultation bilaterally Abdominal:     General: Bowel sounds are normal.     Palpations: Abdomen is soft.     Tenderness: There is abdominal tenderness in the right upper quadrant, epigastric area and left upper quadrant. There is no right CVA tenderness, left CVA tenderness, guarding or rebound.     Comments: Mild tenderness palpation in epigastrium.  Psychiatric:        Behavior: Behavior is cooperative.     UC Treatments / Results  Labs (all labs ordered are listed, but only abnormal results are displayed) Labs Reviewed - No data to display  EKG   Radiology No results found.  Procedures Procedures  (including critical care time)  Medications Ordered in UC Medications - No data to display  Initial Impression / Assessment and Plan / UC Course  I have reviewed the triage vital signs and the nursing notes.  Pertinent labs & imaging results that were available during my care of the patient were reviewed by me  and considered in my medical decision making (see chart for details).     Vital signs and physical exam reassuring today.  Contacted poison control and discussed case with Geraldine Contras, RN who said that if she has no perioral burns or lesions and is able to pass p.o. challenge she does not need emergency care and can be discharged home.  There were no labs recommended for monitoring.  Patient's vital signs and physical exam are reassuring with no perioral lesions.  She was able to drink ginger ale and water without difficulty.  No indication for emergent evaluation at this time.  She does report a burning sensation in her abdomen and discussed with poison control potential utility of Carafate and they said this would be appropriate.  Patient was given work excuse note for several days and encouraged to rest and drink plenty of fluid.  Discussed that if she has any worsening symptoms including change in abdominal pain, sore throat, difficulty swallowing, drooling, nausea/vomiting, blood in her stool, lightheadedness she needs to go directly to the emergency room.  Recommended she follow-up with her primary care provider within a few days.  Strict return precautions given to which she expressed understanding.  Final Clinical Impressions(s) / UC Diagnoses   Final diagnoses:  Accidental ingestion of toxic substance, initial encounter  Upper abdominal pain     Discharge Instructions      As we discussed, you do not need to go to the emergency room at this time but if at any point anything worsens you must go to the ER.  Use Carafate to help coat your stomach.  Avoid spicy/acidic/fatty foods.  Make  sure you are drinking plenty of fluid.  Follow-up with either our clinic or your primary care within a few days to ensure symptom improvement.  If at any point anything changes or worsens and you have worsening abdominal pain, vomiting, difficulty swallowing, drooling, voice change, severe sore throat, blood in your stool, dark stools you must go to the ER as we discussed.     ED Prescriptions     Medication Sig Dispense Auth. Provider   sucralfate (CARAFATE) 1 g tablet Take 1 tablet (1 g total) by mouth 4 (four) times daily -  with meals and at bedtime. 12 tablet Deshara Rossi, Derry Skill, PA-C      PDMP not reviewed this encounter.   Terrilee Croak, PA-C 09/07/21 1052

## 2021-09-19 ENCOUNTER — Other Ambulatory Visit: Payer: Self-pay

## 2021-09-19 ENCOUNTER — Ambulatory Visit: Payer: Self-pay | Admitting: Physician Assistant

## 2021-09-19 VITALS — BP 128/77 | HR 84 | Temp 98.9°F | Resp 18 | Ht 62.0 in | Wt 121.0 lb

## 2021-09-19 DIAGNOSIS — J302 Other seasonal allergic rhinitis: Secondary | ICD-10-CM

## 2021-09-19 DIAGNOSIS — H9201 Otalgia, right ear: Secondary | ICD-10-CM

## 2021-09-19 MED ORDER — FLUTICASONE PROPIONATE 50 MCG/ACT NA SUSP
2.0000 | Freq: Every day | NASAL | 6 refills | Status: AC
  Filled 2021-09-19: qty 16, 30d supply, fill #0
  Filled 2022-03-12: qty 16, 30d supply, fill #1

## 2021-09-19 NOTE — Progress Notes (Signed)
Patient has taken zyrtec today and has eaten today. Patient reports ear trouble beginning 1 week ago. Patient denies pain. Drainage or bleeding from the site.

## 2021-09-19 NOTE — Progress Notes (Signed)
Established Patient Office Visit  Subjective:  Patient ID: Lori Ponce, female    DOB: [birth year]  Age: 26 y.o. MRN: [id number]  CC:  Chief Complaint  Patient presents with   Ear Pain    HPI Lori Ponce reports that she she has been having muffled sounds in her right ear for the past week, states that it will also be painful, states that comes and goes.  Denies qtip use.  Denies any other URI sxs other than "seasonal allergies"  States that she takes zyrtec on a daily basis.  Past Medical History:  Diagnosis Date   Influenza A 07/19/2021    Past Surgical History:  Procedure Laterality Date   NO PAST SURGERIES      Family History  Problem Relation Age of Onset   Diabetes Maternal Grandmother    Diabetes Maternal Grandfather     Social History   Socioeconomic History   Marital status: Single    Spouse name: Not on file   Number of children: 1   Years of education: 10 th grade   Highest education level: Not on file  Occupational History   Not on file  Tobacco Use   Smoking status: Never   Smokeless tobacco: Never  Vaping Use   Vaping Use: Never used  Substance and Sexual Activity   Alcohol use: Yes    Comment: occasionally   Drug use: Yes    Frequency: 2.0 times per week    Types: Marijuana   Sexual activity: Yes    Birth control/protection: None  Other Topics Concern   Not on file  Social History Narrative   Not on file   Social Determinants of Health   Financial Resource Strain: Not on file  Food Insecurity: Not on file  Transportation Needs: Not on file  Physical Activity: Not on file  Stress: Not on file  Social Connections: Not on file  Intimate Partner Violence: Not on file    Outpatient Medications Prior to Visit  Medication Sig Dispense Refill   cetirizine (ZYRTEC) 10 MG tablet Take 10 mg by mouth daily.     ibuprofen (ADVIL) 600 MG tablet Take 1 tablet (600 mg total) by mouth every 6 (six) hours as needed. (Patient not taking:  Reported on 09/19/2021) 30 tablet 0   methocarbamol (ROBAXIN) 500 MG tablet Take 1 tablet (500 mg total) by mouth 2 (two) times daily as needed for muscle spasms. (Patient not taking: Reported on 09/19/2021) 10 tablet 0   sucralfate (CARAFATE) 1 g tablet Take 1 tablet (1 g total) by mouth 4 (four) times daily -  with meals and at bedtime. (Patient not taking: Reported on 09/19/2021) 12 tablet 0   benzonatate (TESSALON) 100 MG capsule Take 1 capsule (100 mg total) by mouth 2 (two) times daily as needed for cough. 30 capsule 0   No facility-administered medications prior to visit.    No Known Allergies  ROS Review of Systems  Constitutional:  Negative for chills and fever.  HENT:  Positive for ear pain and rhinorrhea. Negative for congestion, sinus pressure, sinus pain, sore throat and trouble swallowing.   Eyes: Negative.   Respiratory:  Negative for cough and shortness of breath.   Cardiovascular:  Negative for chest pain.  Gastrointestinal: Negative.   Endocrine: Negative.   Genitourinary: Negative.   Musculoskeletal: Negative.   Skin: Negative.   Allergic/Immunologic: Negative.   Neurological: Negative.   Hematological: Negative.   Psychiatric/Behavioral: Negative.       Objective:  Physical Exam Vitals and nursing note reviewed.  Constitutional:      Appearance: Normal appearance.  HENT:     Head: Normocephalic and atraumatic.     Salivary Glands: Right salivary gland is not diffusely enlarged or tender. Left salivary gland is not diffusely enlarged or tender.     Right Ear: Tympanic membrane, ear canal and external ear normal.     Left Ear: Tympanic membrane, ear canal and external ear normal.     Nose: Nose normal.     Mouth/Throat:     Lips: Pink.     Mouth: Mucous membranes are moist.     Pharynx: Oropharynx is clear.     Tonsils: No tonsillar exudate. 1+ on the right. 0 on the left.  Eyes:     Conjunctiva/sclera: Conjunctivae normal.     Pupils: Pupils are equal,  round, and reactive to light.  Cardiovascular:     Rate and Rhythm: Normal rate and regular rhythm.     Pulses: Normal pulses.     Heart sounds: Normal heart sounds.  Pulmonary:     Effort: Pulmonary effort is normal.     Breath sounds: Normal breath sounds.  Musculoskeletal:        General: Normal range of motion.     Cervical back: Normal range of motion and neck supple.  Lymphadenopathy:     Cervical: No cervical adenopathy.  Skin:    General: Skin is warm and dry.  Neurological:     General: No focal deficit present.     Mental Status: She is alert and oriented to person, place, and time.  Psychiatric:        Mood and Affect: Mood normal.        Behavior: Behavior normal.        Thought Content: Thought content normal.        Judgment: Judgment normal.    BP 128/77 (BP Location: Left Arm, Patient Position: Sitting, Cuff Size: Normal)    Pulse 84    Temp 98.9 F (37.2 C) (Oral)    Resp 18    Ht 5' 2"  (1.575 m)    Wt 121 lb (54.9 kg)    LMP 08/20/2021 (Approximate)    SpO2 100%    BMI 22.13 kg/m  Wt Readings from Last 3 Encounters:  09/19/21 121 lb (54.9 kg)  07/19/21 128 lb 6.4 oz (58.2 kg)  04/30/21 129 lb 0.2 oz (58.5 kg)     Health Maintenance Due  Topic Date Due   COVID-19 Vaccine (3 - Booster for Moderna series) 09/20/2020    There are no preventive care reminders to display for this patient.  Lab Results  Component Value Date   TSH 1.050 04/20/2021   Lab Results  Component Value Date   WBC 5.7 04/20/2021   HGB 13.5 04/20/2021   HCT 39.6 04/20/2021   MCV 93 04/20/2021   PLT 208 04/20/2021   Lab Results  Component Value Date   NA 139 03/27/2021   K 3.6 03/27/2021   CO2 22 03/27/2021   GLUCOSE 84 03/27/2021   BUN 10 03/27/2021   CREATININE 0.62 03/27/2021   BILITOT 0.6 03/27/2021   ALKPHOS 64 03/27/2021   AST 16 03/27/2021   ALT 13 03/27/2021   PROT 7.5 03/27/2021   ALBUMIN 4.8 03/27/2021   CALCIUM 9.5 03/27/2021   EGFR 126 03/27/2021   Lab  Results  Component Value Date   CHOL 138 04/30/2021   Lab Results  Component Value  Date   HDL 46 04/30/2021   Lab Results  Component Value Date   LDLCALC 72 04/30/2021   Lab Results  Component Value Date   TRIG 112 04/30/2021   Lab Results  Component Value Date   CHOLHDL 3.0 04/30/2021   No results found for: HGBA1C    Assessment & Plan:   Problem List Items Addressed This Visit   None Visit Diagnoses     Earache on right    -  Primary   Seasonal allergies       Relevant Medications   fluticasone (FLONASE) 50 MCG/ACT nasal spray       Meds ordered this encounter  Medications   fluticasone (FLONASE) 50 MCG/ACT nasal spray    Sig: Place 2 sprays into both nostrils daily.    Dispense:  16 g    Refill:  6    Order Specific Question:   Supervising Provider    Answer:   Joya Gaskins, PATRICK E [[id number]]  1. Earache on right No cerumen impaction noted, right tonsil slightly enlarged, encouraged trial of Flonase, continue Zyrtec, Tylenol as needed for discomfort.  Patient education given on supportive care, red flags given for prompt reevaluation.  2. Seasonal allergies  - fluticasone (FLONASE) 50 MCG/ACT nasal spray; Place 2 sprays into both nostrils daily.  Dispense: 16 g; Refill: 6   I have reviewed the patient's medical history (PMH, PSH, Social History, Family History, Medications, and allergies) , and have been updated if relevant. I spent 20 minutes reviewing chart and  face to face time with patient.    Follow-up: Return if symptoms worsen or fail to improve.    Loraine Grip Mayers, PA-C

## 2021-09-19 NOTE — Patient Instructions (Signed)
You are going to continue taking your Zyrtec, and I do encourage you to use Flonase twice a day until the symptoms improve.  You can use Tylenol or ibuprofen to help you with the discomfort.  Please feel free to return to the mobile unit if you are not feeling an improvement at the beginning of next week.  Roney Jaffe, PA-C Physician Assistant [facility] [email or web address]   Earache, Adult An earache, or ear pain, can be caused by many things, including: An infection. Ear wax buildup. Ear pressure. Something in the ear that should not be there (foreign body). A sore throat. Tooth problems. Jaw problems. Treatment of the earache will depend on the cause. If the cause is not clear or cannot be determined, you may need to watch your symptoms until your earache goes away or until a cause is found. Follow these instructions at home: Medicines Take or apply over-the-counter and prescription medicines only as told by your health care provider. If you were prescribed an antibiotic medicine, use it as told by your health care provider. Do not stop using the antibiotic even if you start to feel better. Do not put anything in your ear other than medicine that is prescribed by your health care provider. Managing pain If directed, apply heat to the affected area as often as told by your health care provider. Use the heat source that your health care provider recommends, such as a moist heat pack or a heating pad. Place a towel between your skin and the heat source. Leave the heat on for 20-30 minutes. Remove the heat if your skin turns bright red. This is especially important if you are unable to feel pain, heat, or cold. You may have a greater risk of getting burned. If directed, put ice on the affected area as often as told by your health care provider. To do this:   Put ice in a plastic bag. Place a towel between your skin and the  bag. Leave the ice on for 20 minutes, 2-3 times a day. General instructions Pay attention to any changes in your symptoms. Try resting in an upright position instead of lying down. This may help to reduce pressure in your ear and relieve pain. Chew gum if it helps to relieve your ear pain. Treat any allergies as told by your health care provider. Drink enough fluid to keep your urine pale yellow. It is up to you to get the results of any tests that were done. Ask your health care provider, or the department that is doing the tests, when your results will be ready. Keep all follow-up visits as told by your health care provider. This is important. Contact a health care provider if: Your pain does not improve within 2 days. Your earache gets worse. You have new symptoms. You have a fever. Get help right away if you: Have a severe headache. Have a stiff neck. Have trouble swallowing. Have redness or swelling behind your ear. Have fluid or blood coming from your ear. Have hearing loss. Feel dizzy. Summary An earache, or ear pain, can be caused by many things. Treatment of the earache will depend on the cause. Follow recommendations from your health care provider to treat your ear pain. If the cause is not clear or cannot be determined, you may need to watch your symptoms until your earache goes away or until a cause is found. Keep all follow-up visits as told by your health care provider. This is  important. This information is not intended to replace advice given to you by your health care provider. Make sure you discuss any questions you have with your health care provider. Document Revised: 03/06/2019 Document Reviewed: 03/06/2019 Elsevier Patient Education  2022 [employer].

## 2021-09-24 ENCOUNTER — Ambulatory Visit: Payer: Self-pay | Admitting: Critical Care Medicine

## 2021-10-22 ENCOUNTER — Telehealth: Payer: Self-pay | Admitting: Critical Care Medicine

## 2021-10-22 DIAGNOSIS — G8929 Other chronic pain: Secondary | ICD-10-CM

## 2021-10-22 NOTE — Telephone Encounter (Signed)
Pt called requesting referral for ortho right knee in pain, when driving more than 30 mins, pt has [employer] ?

## 2021-10-24 NOTE — Telephone Encounter (Signed)
Fyi.

## 2021-10-24 NOTE — Telephone Encounter (Signed)
Ortho ref sent.

## 2021-10-24 NOTE — Telephone Encounter (Signed)
Called pt and she is aware of notes  ?

## 2021-10-30 ENCOUNTER — Ambulatory Visit
Admission: EM | Admit: 2021-10-30 | Discharge: 2021-10-30 | Disposition: A | Discharge status: Home / Self Care | Payer: Self-pay | Attending: Internal Medicine | Admitting: Internal Medicine

## 2021-10-30 ENCOUNTER — Ambulatory Visit (INDEPENDENT_AMBULATORY_CARE_PROVIDER_SITE_OTHER): Payer: Self-pay

## 2021-10-30 ENCOUNTER — Encounter: Payer: Self-pay | Admitting: Emergency Medicine

## 2021-10-30 ENCOUNTER — Other Ambulatory Visit: Payer: Self-pay

## 2021-10-30 DIAGNOSIS — M79672 Pain in left foot: Secondary | ICD-10-CM

## 2021-10-30 DIAGNOSIS — Z23 Encounter for immunization: Secondary | ICD-10-CM

## 2021-10-30 DIAGNOSIS — S90852A Superficial foreign body, left foot, initial encounter: Secondary | ICD-10-CM

## 2021-10-30 MED ORDER — TETANUS-DIPHTH-ACELL PERTUSSIS 5-2.5-18.5 LF-MCG/0.5 IM SUSY
0.5000 mL | PREFILLED_SYRINGE | Freq: Once | INTRAMUSCULAR | Status: AC
  Administered 2021-10-30: 0.5 mL via INTRAMUSCULAR

## 2021-10-30 NOTE — ED Provider Notes (Signed)
?[facility] ? ? ? ?CSN: [id number] ?Arrival date & time: 10/30/21  8938 ? ? ?  ? ?History   ?Chief Complaint ?Chief Complaint  ?Patient presents with  ? Foot Pain  ? ? ?HPI ?Lori Ponce is a 26 y.o. female.  ? ?Patient presents for concern of possible foreign body to plantar surface of left foot.  Patient states that she was walking on carpet in a remodeled house and thinks that she got some glass in her foot.  She pulled some glass out but has been having persistent pain in that area so is not sure if there is any glass still present.  Denies any swelling, erythema, purulent drainage from the area.  Denies any fevers.  Tetanus vaccine is not up-to-date per patient. ? ? ?Foot Pain ? ? ?Past Medical History:  ?Diagnosis Date  ? Influenza A 07/19/2021  ? ? ?Patient Active Problem List  ? Diagnosis Date Noted  ? Major depressive disorder, recurrent episode with anxious distress (HCC) 06/07/2020  ? Recurrent moderate major depressive disorder with anxiety (HCC) 05/15/2020  ? Anxiety disorder 05/15/2020  ? Hx of self-harm 05/04/2020  ? Atopic dermatitis, mild 09/13/2016  ? Migraine headache 10/13/2012  ? ? ?Past Surgical History:  ?Procedure Laterality Date  ? NO PAST SURGERIES    ? ? ?OB History   ?No obstetric history on file. ?  ? ? ? ?Home Medications   ? ?Prior to Admission medications   ?Medication Sig Start Date End Date Taking? Authorizing Provider  ?cetirizine (ZYRTEC) 10 MG tablet Take 10 mg by mouth daily.   Yes [provider]  ?fluticasone (FLONASE) 50 MCG/ACT nasal spray Place 2 sprays into both nostrils daily. 09/19/21  Yes Mayers, Cari S, PA-C  ?ibuprofen (ADVIL) 600 MG tablet Take 1 tablet (600 mg total) by mouth every 6 (six) hours as needed. ?Patient not taking: Reported on 09/19/2021 07/19/21   Storm Frisk, MD  ?methocarbamol (ROBAXIN) 500 MG tablet Take 1 tablet (500 mg total) by mouth 2 (two) times daily as needed for muscle spasms. ?Patient not taking: Reported on  09/19/2021 07/19/21   Storm Frisk, MD  ?sucralfate (CARAFATE) 1 g tablet Take 1 tablet (1 g total) by mouth 4 (four) times daily -  with meals and at bedtime. ?Patient not taking: Reported on 09/19/2021 09/07/21   Raspet, Noberto Retort, PA-C  ?escitalopram (LEXAPRO) 20 MG tablet Take 1 tablet (20 mg total) by mouth daily. 07/17/20 09/28/20  Marcine Matar, MD  ? ? ?Family History ?Family History  ?Problem Relation Age of Onset  ? Diabetes Maternal Grandmother   ? Diabetes Maternal Grandfather   ? ? ?Social History ?Social History  ? ?Tobacco Use  ? Smoking status: Never  ? Smokeless tobacco: Never  ?Vaping Use  ? Vaping Use: Never used  ?Substance Use Topics  ? Alcohol use: Yes  ?  Comment: occasionally  ? Drug use: Yes  ?  Frequency: 2.0 times per week  ?  Types: Marijuana  ? ? ? ?Allergies   ?Patient has no known allergies. ? ? ?Review of Systems ?Review of Systems ?Per HPI ? ?Physical Exam ?Triage Vital Signs ?ED Triage Vitals [10/30/21 1018]  ?Enc Vitals Group  ?   BP 112/70  ?   Pulse Rate 68  ?   Resp 18  ?   Temp 98.2 ?F (36.8 ?C)  ?   Temp Source Oral  ?   SpO2 98 %  ?  Weight 121 lb 0.5 oz (54.9 kg)  ?   Height 5\' 2"  (1.575 m)  ?   Head Circumference   ?   Peak Flow   ?   Pain Score 8  ?   Pain Loc   ?   Pain Edu?   ?   Excl. in GC?   ? ?No data found. ? ?Updated Vital Signs ?BP 112/70 (BP Location: Left Arm)   Pulse 68   Temp 98.2 ?F (36.8 ?C) (Oral)   Resp 18   Ht 5\' 2"  (1.575 m)   Wt 121 lb 0.5 oz (54.9 kg)   LMP 10/02/2021   SpO2 98%   BMI 22.14 kg/m?  ? ?Visual Acuity ?Right Eye Distance:   ?Left Eye Distance:   ?Bilateral Distance:   ? ?Right Eye Near:   ?Left Eye Near:    ?Bilateral Near:    ? ?Physical Exam ?Constitutional:   ?   General: She is not in acute distress. ?   Appearance: Normal appearance. She is not toxic-appearing or diaphoretic.  ?HENT:  ?   Head: Normocephalic and atraumatic.  ?Eyes:  ?   Extraocular Movements: Extraocular movements intact.  ?   Conjunctiva/sclera: Conjunctivae  normal.  ?Pulmonary:  ?   Effort: Pulmonary effort is normal.  ?Feet:  ?   Comments: Foot is not tender to palpation.  No obvious erythema, swelling, purulent drainage noted.  No obvious foreign body noted.  Neurovascular intact. ?Neurological:  ?   General: No focal deficit present.  ?   Mental Status: She is alert and oriented to person, place, and time. Mental status is at baseline.  ?Psychiatric:     ?   Mood and Affect: Mood normal.     ?   Behavior: Behavior normal.     ?   Thought Content: Thought content normal.     ?   Judgment: Judgment normal.  ? ? ? ?UC Treatments / Results  ?Labs ?(all labs ordered are listed, but only abnormal results are displayed) ?Labs Reviewed - No data to display ? ?EKG ? ? ?Radiology ?DG Foot Complete Left ? ?Result Date: 10/30/2021 ?CLINICAL DATA:  Concern for retained glass foreign body EXAM: LEFT FOOT - COMPLETE 3+ VIEW COMPARISON:  None. FINDINGS: There is no evidence of fracture or dislocation. There is no evidence of arthropathy or other focal bone abnormality. Soft tissues are unremarkable. No radiopaque foreign body is seen within the soft tissues. IMPRESSION: Negative. No radiopaque foreign body. If persistent clinical concern for retained foreign body, ultrasound could be considered. Electronically Signed   By: 10/04/2021 D.O.   On: 10/30/2021 10:47   ? ?Procedures ?Procedures (including critical care time) ? ?Medications Ordered in UC ?Medications  ?Tdap (BOOSTRIX) injection 0.5 mL (0.5 mLs Intramuscular Given 10/30/21 1058)  ? ? ?Initial Impression / Assessment and Plan / UC Course  ?I have reviewed the triage vital signs and the nursing notes. ? ?Pertinent labs & imaging results that were available during my care of the patient were reviewed by me and considered in my medical decision making (see chart for details). ? ?  ? ?Left foot x-ray was completed to see if there is any possible foreign body on x-ray.  Although, patient was advised that due to material that  may be present in foot, the x-ray may not pick it up.  Patient voiced understanding.  There is no obvious notable discerning marking from possible foreign body.  Therefore, removal of foreign  body is not conducive in urgent care today.  Patient was advised that she will need to follow-up with podiatry for further evaluation and management at provided contact information for possible ultrasound to be performed and foreign body removed if it is present.  Also advised patient that she can go to the hospital for ultrasound to be completed but I do not have the ability to order ultrasound in urgent care. Tetanus vaccine updated today as well.  Patient verbalized understanding and was agreeable with plan. ?Final Clinical Impressions(s) / UC Diagnoses  ? ?Final diagnoses:  ?Foreign body in left foot, initial encounter  ? ? ? ?Discharge Instructions   ? ?  ?There were no obvious foreign bodies on x-ray.  Please soak foot in warm Epsom salt soak.  Follow-up with podiatry for further evaluation and management. ? ? ? ? ?ED Prescriptions   ?None ?  ? ?PDMP not reviewed this encounter. ?  ?Gustavus BryantMound, Lonzell Dorris E, [place]FNP ?10/30/21 1102 ? ?

## 2021-10-30 NOTE — Discharge Instructions (Signed)
There were no obvious foreign bodies on x-ray.  Please soak foot in warm Epsom salt soak.  Follow-up with podiatry for further evaluation and management. ?

## 2021-10-30 NOTE — ED Triage Notes (Signed)
Patient states that she accidentally stepped in some glass with her left foot on Sunday.  Patient thought she got all the glass out but continues to have pain. ?

## 2021-11-01 ENCOUNTER — Ambulatory Visit: Payer: No Typology Code available for payment source | Admitting: Podiatry

## 2021-11-06 ENCOUNTER — Other Ambulatory Visit: Payer: Self-pay

## 2021-11-06 ENCOUNTER — Encounter: Payer: Self-pay | Admitting: Orthopaedic Surgery

## 2021-11-06 ENCOUNTER — Ambulatory Visit (INDEPENDENT_AMBULATORY_CARE_PROVIDER_SITE_OTHER): Payer: Self-pay | Admitting: Orthopaedic Surgery

## 2021-11-06 ENCOUNTER — Ambulatory Visit (INDEPENDENT_AMBULATORY_CARE_PROVIDER_SITE_OTHER): Payer: Self-pay

## 2021-11-06 VITALS — Ht 62.0 in | Wt 128.0 lb

## 2021-11-06 DIAGNOSIS — M25561 Pain in right knee: Secondary | ICD-10-CM

## 2021-11-06 DIAGNOSIS — G8929 Other chronic pain: Secondary | ICD-10-CM

## 2021-11-06 MED ORDER — DICLOFENAC SODIUM 75 MG PO TBEC
75.0000 mg | DELAYED_RELEASE_TABLET | Freq: Two times a day (BID) | ORAL | 2 refills | Status: DC | PRN
  Filled 2021-11-06: qty 60, 30d supply, fill #0

## 2021-11-06 NOTE — Progress Notes (Signed)
? ?Office Visit Note ?  ?Patient: Lori Ponce           ?Date of Birth: 01-02-1996           ?MRN: [id number] ?Visit Date: 11/06/2021 ?             ?Requested by: Storm Frisk, MD ?[address] ?[address] ?[place],  [place] [address] ?PCP: Storm Frisk, MD ? ? ?Assessment & Plan: ?Visit Diagnoses:  ?1. Chronic pain of right knee   ? ? ?Plan: Impression is chronic right knee pain likely job-related.  At this point, would like to try her on a course of prescription anti-inflammatories.  Have also provided her with a knee strengthening exercise program.  If her symptoms fail to improve over the next 4 to 6 weeks, she will follow-up with [place] for repeat evaluation and possible cortisone injection.  Call with concerns or questions. ? ?Follow-Up Instructions: Return in about 4 weeks (around 12/04/2021).  ? ?Orders:  ?Orders Placed This Encounter  ?Procedures  ? XR KNEE 3 VIEW RIGHT  ? ?Meds ordered this encounter  ?Medications  ? diclofenac (VOLTAREN) 75 MG EC tablet  ?  Sig: Take 1 tablet (75 mg total) by mouth 2 (two) times daily as needed.  ?  Dispense:  60 tablet  ?  Refill:  2  ? ? ? ? Procedures: ?No procedures performed ? ? ?Clinical Data: ?No additional findings. ? ? ?Subjective: ?Chief Complaint  ?Patient presents with  ? Right Knee - Pain  ? ? ?HPI patient is a pleasant 26 year old female who comes in today with right knee pain for the past month and a half.  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  Majority of her pain is to the anterior medial aspect.  She denies any pain at rest.  The majority of the pain occurs when she is getting in and out of cars at work as well as when she is running to retrieve the car.  She has not taken any medication for this.  No previous injection or surgery to the right knee. ? ?Review of Systems as detailed in HPI.  All others reviewed and are negative. ? ? ?Objective: ?Vital Signs: Ht 5\' 2"  (1.575  m)   Wt 128 lb (58.1 kg)   BMI 23.41 kg/m?  ? ?Physical Exam well-developed well-nourished female no acute distress.  Alert and oriented x3. ? ?Ortho Exam right knee exam shows no effusion.  Range of motion 0 to 125 degrees.  She does have tenderness to the lateral patella facet as well as the medial joint line.  She is stable vascular stress.  She is neurovascular intact distally. ? ?Specialty Comments:  ?No specialty comments available. ? ?Imaging: ?XR KNEE 3 VIEW RIGHT ? ?Result Date: 11/06/2021 ?No acute or structural abnormalities  ? ? ?PMFS History: ?Patient Active Problem List  ? Diagnosis Date Noted  ? Major depressive disorder, recurrent episode with anxious distress (HCC) 06/07/2020  ? Recurrent moderate major depressive disorder with anxiety (HCC) 05/15/2020  ? Anxiety disorder 05/15/2020  ? Hx of self-harm 05/04/2020  ? Atopic dermatitis, mild 09/13/2016  ? Migraine headache 10/13/2012  ? ?Past Medical History:  ?Diagnosis Date  ? Influenza A 07/19/2021  ?  ?Family History  ?Problem Relation Age of Onset  ? Diabetes Maternal Grandmother   ? Diabetes Maternal Grandfather   ?  ?Past Surgical History:  ?Procedure  Laterality Date  ? NO PAST SURGERIES    ? ?Social History  ? ?Occupational History  ? Not on file  ?Tobacco Use  ? Smoking status: Never  ? Smokeless tobacco: Never  ?Vaping Use  ? Vaping Use: Never used  ?Substance and Sexual Activity  ? Alcohol use: Yes  ?  Comment: occasionally  ? Drug use: Yes  ?  Frequency: 2.0 times per week  ?  Types: Marijuana  ? Sexual activity: Yes  ?  Birth control/protection: None  ? ? ? ? ? ? ?

## 2021-11-13 ENCOUNTER — Other Ambulatory Visit: Payer: Self-pay

## 2021-11-15 ENCOUNTER — Other Ambulatory Visit: Payer: Self-pay

## 2021-11-15 ENCOUNTER — Ambulatory Visit (INDEPENDENT_AMBULATORY_CARE_PROVIDER_SITE_OTHER): Payer: No Typology Code available for payment source | Admitting: Podiatry

## 2021-11-15 ENCOUNTER — Ambulatory Visit (INDEPENDENT_AMBULATORY_CARE_PROVIDER_SITE_OTHER): Payer: No Typology Code available for payment source

## 2021-11-15 DIAGNOSIS — S90852A Superficial foreign body, left foot, initial encounter: Secondary | ICD-10-CM

## 2021-11-15 DIAGNOSIS — M722 Plantar fascial fibromatosis: Secondary | ICD-10-CM

## 2021-11-15 DIAGNOSIS — S99922A Unspecified injury of left foot, initial encounter: Secondary | ICD-10-CM

## 2021-11-15 MED ORDER — MELOXICAM 15 MG PO TABS
15.0000 mg | ORAL_TABLET | Freq: Every day | ORAL | 3 refills | Status: DC
  Filled 2021-11-15: qty 30, 30d supply, fill #0

## 2021-11-15 NOTE — Patient Instructions (Signed)

## 2021-11-18 ENCOUNTER — Encounter: Payer: Self-pay | Admitting: Podiatry

## 2021-11-18 NOTE — Progress Notes (Signed)
?  Subjective:  ?Patient ID: Lori Ponce, female    DOB: 13-Jul-1996,  MRN: [id number] ? ?Chief Complaint  ?Patient presents with  ? Foot Injury  ?  NP  Left foot injury  ? ? ?26 y.o. female presents with the above complaint. History confirmed with patient.  She Has a glass in her foot a few weeks ago from today.  Still quite painful.  Does not see a scab or callus she think she got all of it out but still tender.  It hurts from the heel down into her arch. ? ?Objective:  ?Physical Exam: ?warm, good capillary refill, no trophic changes or ulcerative lesions, normal DP and PT pulses, and normal sensory exam. ?Left Foot: point tenderness over the heel pad, point tenderness of the mid plantar fascia, and no noted puncture wound or open lesion or wound or evidence of retained foreign body ? ? ?Radiographs: ?Multiple views x-ray of the left foot: no fracture, dislocation, swelling or degenerative changes noted and no evidence of retained foreign body on plain film x-ray ?Assessment:  ? ?1. Foreign body in left foot, initial encounter   ?2. Plantar fasciitis of left foot   ? ? ? ?Plan:  ?Patient was evaluated and treated and all questions answered. ? ?I discussed with her that I was unable to detect any sort of retained foreign body or open puncture wound.  I did gently debride the area and was unable to see a puncture wound or express anything.  There were no evidence of retained foreign body on plain film x-ray.  I discussed if this continues to be an issue we could order an ultrasound to evaluate and surgical excision could alleviate this but will require time off work to allow the sutures to heal in the plantar foot. ? ?I also think a component of this is likely plantar fasciitis as well that radiates into her arch.  This more related to her job. Discussed the etiology and treatment options for plantar fasciitis including stretching, formal physical therapy, supportive shoegears such as a running shoe or sneaker,  pre fabricated orthoses, injection therapy, and oral medications. We also discussed the role of surgical treatment of this for patients who do not improve after exhausting non-surgical treatment options. ? ? ?-XR reviewed with patient ?-Educated patient on stretching and icing of the affected limb ?-Rx for meloxicam. Educated on use, risks and benefits of the medication ? ? ?Return if symptoms worsen or fail to improve.  ? ?

## 2021-11-22 ENCOUNTER — Other Ambulatory Visit: Payer: Self-pay

## 2022-01-01 ENCOUNTER — Encounter: Payer: Self-pay | Admitting: Orthopaedic Surgery

## 2022-01-01 ENCOUNTER — Ambulatory Visit (INDEPENDENT_AMBULATORY_CARE_PROVIDER_SITE_OTHER): Payer: Self-pay | Admitting: Orthopaedic Surgery

## 2022-01-01 DIAGNOSIS — M25561 Pain in right knee: Secondary | ICD-10-CM

## 2022-01-01 DIAGNOSIS — G5602 Carpal tunnel syndrome, left upper limb: Secondary | ICD-10-CM

## 2022-01-01 DIAGNOSIS — G8929 Other chronic pain: Secondary | ICD-10-CM

## 2022-01-01 MED ORDER — METHYLPREDNISOLONE ACETATE 40 MG/ML IJ SUSP
40.0000 mg | INTRAMUSCULAR | Status: AC | PRN
  Administered 2022-01-01: 40 mg via INTRA_ARTICULAR

## 2022-01-01 MED ORDER — LIDOCAINE HCL 1 % IJ SOLN
2.0000 mL | INTRAMUSCULAR | Status: AC | PRN
  Administered 2022-01-01: 2 mL

## 2022-01-01 MED ORDER — BUPIVACAINE HCL 0.25 % IJ SOLN
2.0000 mL | INTRAMUSCULAR | Status: AC | PRN
  Administered 2022-01-01: 2 mL via INTRA_ARTICULAR

## 2022-01-01 NOTE — Progress Notes (Signed)
Office Visit Note   Patient: Lori Ponce           Date of Birth: 09-Sep-1995           MRN: [id number] Visit Date: 01/01/2022              Requested by: Elsie Stain, MD [address] [place],  [place] [address] PCP: Elsie Stain, MD   Assessment & Plan: Visit Diagnoses:  1. Carpal tunnel syndrome, left upper limb   2. Chronic pain of right knee     Plan: Impression is chronic right knee pain and left hand paresthesias concerning for carpal tunnel syndrome.  Regards to the right knee, did discuss proceeding with cortisone injection today for which she is agreeable to.  She will follow-up as needed for her knee.  Regards to the left hand paresthesias, I would like to provide her with a removable wrist splint to wear at night as well as make referral to Dr. Ernestina Patches for nerve conduction study.  Follow-up with [place] once completed.  Call with concerns or questions.  Follow-Up Instructions: No follow-ups on file.   Orders:  No orders of the defined types were placed in this encounter.  No orders of the defined types were placed in this encounter.     Procedures: Large Joint Inj: R knee on 01/01/2022 3:44 PM Indications: pain Details: 22 G needle, anterolateral approach Medications: 2 mL lidocaine 1 %; 2 mL bupivacaine 0.25 %; 40 mg methylPREDNISolone acetate 40 MG/ML     Clinical Data: No additional findings.   Subjective: Chief Complaint  Patient presents with   Right Knee - Pain, Follow-up    HPI patient is a pleasant 26 year old female who comes in today with continued right knee pain as well as left hand paresthesias.  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  She was prescribed diclofenac which she did not take.  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  Majority is to the anteromedial aspect.  Driving is aggravating.  No previous cortisone injection.  In regards to the left hand paresthesias, she has had these for a while.  She gets numbness into the thumb, index and long fingers.  Worse with walking as well as at night where she frequently wakes up shaking her hands.  She also has associated cramping.  Review of Systems as detailed in HPI.  All others reviewed and are negative.   Objective: Vital Signs: There were no vitals taken for this visit.  Physical Exam well-developed well-nourished female no acute distress.  Alert and oriented x3.  Ortho Exam right knee exam shows no effusion.  Range of motion 0 to 130 degrees.  Medial joint line tenderness.  She is neurovascular intact distally.  Left hand exam shows negative Phalen.  Positive Tinel at the wrist.  No thenar atrophy.  She is neurovascular intact distally.  Specialty Comments:  No specialty comments available.  Imaging: No new imaging   PMFS History: Patient Active Problem List   Diagnosis Date Noted   Major depressive disorder, recurrent episode with anxious distress ([place]) 06/07/2020   Recurrent moderate major depressive disorder with anxiety ([place]) 05/15/2020   Anxiety disorder  05/15/2020   Hx of self-harm 05/04/2020   Atopic dermatitis, mild 09/13/2016   Migraine headache 10/13/2012   Past Medical History:  Diagnosis Date   Influenza A 07/19/2021    Family History  Problem Relation Age of Onset   Diabetes Maternal Grandmother    Diabetes Maternal Grandfather     Past Surgical History:  Procedure Laterality Date   NO PAST SURGERIES     Social History   Occupational History   Not on file  Tobacco Use   Smoking status: Never   Smokeless tobacco: Never  Vaping Use   Vaping Use: Never used  Substance and Sexual Activity   Alcohol use: Yes    Comment: occasionally   Drug use: Yes    Frequency: 2.0 times per week    Types:  Marijuana   Sexual activity: Yes    Birth control/protection: None

## 2022-01-14 ENCOUNTER — Ambulatory Visit
Admission: RE | Admit: 2022-01-14 | Discharge: 2022-01-14 | Disposition: A | Discharge status: Home / Self Care | Payer: No Typology Code available for payment source | Source: Ambulatory Visit | Attending: Nurse Practitioner | Admitting: Nurse Practitioner

## 2022-01-14 ENCOUNTER — Ambulatory Visit (HOSPITAL_COMMUNITY): Payer: Self-pay | Admitting: Psychiatry

## 2022-01-14 VITALS — BP 104/70 | HR 59 | Temp 98.3°F | Resp 20

## 2022-01-14 DIAGNOSIS — R1011 Right upper quadrant pain: Secondary | ICD-10-CM

## 2022-01-14 DIAGNOSIS — K8050 Calculus of bile duct without cholangitis or cholecystitis without obstruction: Secondary | ICD-10-CM

## 2022-01-14 MED ORDER — KETOROLAC TROMETHAMINE 10 MG PO TABS: 10.0000 mg | ORAL_TABLET | Freq: Four times a day (QID) | ORAL | 0 refills | Status: DC | PRN

## 2022-01-14 MED ORDER — KETOROLAC TROMETHAMINE 60 MG/2ML IM SOLN
60.0000 mg | Freq: Once | INTRAMUSCULAR | Status: AC
  Administered 2022-01-14: 60 mg via INTRAMUSCULAR

## 2022-01-14 NOTE — Discharge Instructions (Addendum)
Drink plenty of fluids  Take medications as needed for pain  Do not take any OTC NSAIDs (e.g. aleve, ibuprofen, motrin, advil, etc.)  Eat bland foods as tolerated  Avoid fatty, greasy, and fried foods. Avoid any foods that make the pain worse. Avoid overeating. Avoid consuming a large meal after not eating for a while. Go to the ED immediately if your pain gets worse, you start to have nausea and vomiting, your skin or the whites of your eyes look yellow (jaundice), you have tea-colored urine, light-colored stools (feces) or become dizzy or faint.

## 2022-01-14 NOTE — ED Triage Notes (Signed)
Pt here with RUQ pain that pt states is mostly superficial, but has some internal cramping as well. States she feels some numbness and tingling. Pt has a hx of some nerve pain.

## 2022-01-14 NOTE — ED Provider Notes (Signed)
[facility]    CSN: [id number] Arrival date & time: 01/14/22  1514      History   Chief Complaint Chief Complaint  Patient presents with   Abdominal Pain    Entered by patient    HPI Graceson Stecklein is a 26 y.o. female.   Subjective:   Yuleidi Seher is a 26 y.o. female who presents for evaluation of abdominal pain. The pain is described as sharp, and is 8/10 in intensity. Pain is located in the RUQ without radiation. Onset was acute around midnight while at work. Symptoms have been waxing and waning since onset. Aggravating factors: palpating affected area and laying on stomach. Alleviating factors: none. She also endorses decreased appetite. She has not tried anything for her symptoms. The patient denies chills, constipation, diarrhea, fever, frequency, headache, nausea, and vomiting.  Patient has no previous history of abdominal surgeries.  Patient also requesting STD testing while she is here. She denies any symptoms or known exposure but just "wants to get checked out." She is sexually active one female partner. She does not use protection. She has a history of chlamydia over 8 years ago. Patient is currently on her menses.  She denies any symptoms or known  The following portions of the patient's history were reviewed and updated as appropriate: allergies, current medications, past family history, past medical history, past social history, past surgical history, and problem list.    26 year old female presenting for acute right upper quadrant pain that started about 18 hours ago.  The pain has been waxing and waning since onset.  He has some decreased appetite but no fevers, chills, diarrhea, constipation or urinary symptoms.  Patient also requesting complete STD testing while she is here but is currently on her menses.  She denies any known STI exposure.  The patient has changed her mind and will get STD testing at a later date.      Past Medical History:   Diagnosis Date   Influenza A 07/19/2021    Patient Active Problem List   Diagnosis Date Noted   Major depressive disorder, recurrent episode with anxious distress ([place]) 06/07/2020   Recurrent moderate major depressive disorder with anxiety ([place]) 05/15/2020   Anxiety disorder 05/15/2020   Hx of self-harm 05/04/2020   Atopic dermatitis, mild 09/13/2016   Migraine headache 10/13/2012    Past Surgical History:  Procedure Laterality Date   NO PAST SURGERIES      OB History   No obstetric history on file.      Home Medications    Prior to Admission medications   Medication Sig Start Date End Date Taking? Authorizing Provider  ketorolac (TORADOL) 10 MG tablet Take 1 tablet (10 mg total) by mouth every 6 (six) hours as needed. 01/14/22  Yes Enrique Sack, FNP  cetirizine (ZYRTEC) 10 MG tablet Take 10 mg by mouth daily.    [provider]  fluticasone (FLONASE) 50 MCG/ACT nasal spray Place 2 sprays into both nostrils daily. 09/19/21   Mayers, Cari S, PA-C  escitalopram (LEXAPRO) 20 MG tablet Take 1 tablet (20 mg total) by mouth daily. 07/17/20 09/28/20  Ladell Pier, MD    Family History Family History  Problem Relation Age of Onset   Diabetes Maternal Grandmother    Diabetes Maternal Grandfather     Social History Social History   Tobacco Use   Smoking status: Never   Smokeless tobacco: Never  Vaping Use   Vaping Use: Never used  Substance Use Topics  Alcohol use: Yes    Comment: occasionally   Drug use: Yes    Frequency: 2.0 times per week    Types: Marijuana     Allergies   Patient has no known allergies.   Review of Systems Review of Systems  Constitutional:  Positive for appetite change. Negative for fever.  Gastrointestinal:  Positive for abdominal pain. Negative for abdominal distention, constipation, diarrhea, nausea, rectal pain and vomiting.  Genitourinary:  Negative for dysuria, frequency, menstrual problem and vaginal discharge.   Musculoskeletal:  Negative for back pain.  All other systems reviewed and are negative.   Physical Exam Triage Vital Signs ED Triage Vitals  Enc Vitals Group     BP 01/14/22 1611 104/70     Pulse Rate 01/14/22 1611 (!) 59     Resp 01/14/22 1611 20     Temp 01/14/22 1611 98.3 F (36.8 C)     Temp src --      SpO2 01/14/22 1611 98 %     Weight --      Height --      Head Circumference --      Peak Flow --      Pain Score 01/14/22 1609 6     Pain Loc --      Pain Edu? --      Excl. in [place]? --    No data found.  Updated Vital Signs BP 104/70   Pulse (!) 59   Temp 98.3 F (36.8 C)   Resp 20   SpO2 98%   Visual Acuity Right Eye Distance:   Left Eye Distance:   Bilateral Distance:    Right Eye Near:   Left Eye Near:    Bilateral Near:     Physical Exam Vitals reviewed.  Constitutional:      General: She is not in acute distress.    Appearance: She is well-developed. She is not ill-appearing, toxic-appearing or diaphoretic.  HENT:     Head: Normocephalic.  Cardiovascular:     Rate and Rhythm: Normal rate.     Heart sounds: Normal heart sounds.  Pulmonary:     Effort: Pulmonary effort is normal.  Abdominal:     General: Bowel sounds are normal.     Palpations: Abdomen is soft.     Tenderness: There is abdominal tenderness in the right upper quadrant. There is no right CVA tenderness, left CVA tenderness, guarding or rebound.  Skin:    General: Skin is warm and dry.  Neurological:     General: No focal deficit present.     Mental Status: She is alert and oriented to person, place, and time.     UC Treatments / Results  Labs (all labs ordered are listed, but only abnormal results are displayed) Labs Reviewed  COMPREHENSIVE METABOLIC PANEL  LIPASE  CBC    EKG   Radiology No results found.  Procedures Procedures (including critical care time)  Medications Ordered in UC Medications  ketorolac (TORADOL) injection 60 mg (has no administration in  time range)    Initial Impression / Assessment and Plan / UC Course  I have reviewed the triage vital signs and the nursing notes.  Pertinent labs & imaging results that were available during my care of the patient were reviewed by me and considered in my medical decision making (see chart for details).    Patient is alert and oriented x3.  Afebrile.  Vital signs stable.  She has tenderness to the right upper quadrant  without rebound or bounding.  Abdomen is nondistended.  She appears nontoxic.  Differential diagnosis is quite broad.  Will treat for biliary colic now with pain management and fluids.  CBC, CMP and lipase pending.  Possible diagnosis, pending studies, treatment and indications for immediate ED evaluation reviewed with patient.  Today's evaluation has revealed no signs of a dangerous process. Discussed diagnosis with patient and/or guardian. Patient and/or guardian aware of their diagnosis, possible red flag symptoms to watch out for and need for close follow up. Patient and/or guardian understands verbal and written discharge instructions. Patient and/or guardian comfortable with plan and disposition.  Patient and/or guardian has a clear mental status at this time, good insight into illness (after discussion and teaching) and has clear judgment to make decisions regarding their care  Documentation was completed with the aid of voice recognition software. Transcription may contain typographical errors. Final Clinical Impressions(s) / UC Diagnoses   Final diagnoses:  RUQ pain  Biliary colic     Discharge Instructions      Drink plenty of fluids  Take medications as needed for pain  Do not take any OTC NSAIDs (e.g. aleve, ibuprofen, motrin, advil, etc.)  Eat bland foods as tolerated  Avoid fatty, greasy, and fried foods. Avoid any foods that make the pain worse. Avoid overeating. Avoid consuming a large meal after not eating for a while. Go to the ED immediately if your  pain gets worse, you start to have nausea and vomiting, your skin or the whites of your eyes look yellow (jaundice), you have tea-colored urine, light-colored stools (feces) or become dizzy or faint.     ED Prescriptions     Medication Sig Dispense Auth. Provider   ketorolac (TORADOL) 10 MG tablet Take 1 tablet (10 mg total) by mouth every 6 (six) hours as needed. 20 tablet Enrique Sack, FNP      PDMP not reviewed this encounter.   Enrique Sack, [place] 01/14/22 Vernelle Emerald

## 2022-01-15 ENCOUNTER — Ambulatory Visit: Payer: Medicaid Other | Admitting: Orthopaedic Surgery

## 2022-01-15 LAB — CBC
Hematocrit: 34.9 % (ref 34.0–46.6)
Hemoglobin: 12.2 g/dL (ref 11.1–15.9)
MCH: 32.3 pg (ref 26.6–33.0)
MCHC: 35 g/dL (ref 31.5–35.7)
MCV: 92 fL (ref 79–97)
Platelets: 205 10*3/uL (ref 150–450)
RBC: 3.78 x10E6/uL (ref 3.77–5.28)
RDW: 12.1 % (ref 11.7–15.4)
WBC: 6.3 10*3/uL (ref 3.4–10.8)

## 2022-01-15 LAB — COMPREHENSIVE METABOLIC PANEL
ALT: 12 IU/L (ref 0–32)
AST: 13 IU/L (ref 0–40)
Albumin/Globulin Ratio: 1.7 (ref 1.2–2.2)
Albumin: 4.6 g/dL (ref 3.9–5.0)
Alkaline Phosphatase: 78 IU/L (ref 44–121)
BUN/Creatinine Ratio: 21 (ref 9–23)
BUN: 10 mg/dL (ref 6–20)
Bilirubin Total: 0.7 mg/dL (ref 0.0–1.2)
CO2: 23 mmol/L (ref 20–29)
Calcium: 9.1 mg/dL (ref 8.7–10.2)
Chloride: 108 mmol/L — ABNORMAL HIGH (ref 96–106)
Creatinine, Ser: 0.48 mg/dL — ABNORMAL LOW (ref 0.57–1.00)
Globulin, Total: 2.7 g/dL (ref 1.5–4.5)
Glucose: 95 mg/dL (ref 70–99)
Potassium: 4 mmol/L (ref 3.5–5.2)
Sodium: 145 mmol/L — ABNORMAL HIGH (ref 134–144)
Total Protein: 7.3 g/dL (ref 6.0–8.5)
eGFR: 133 mL/min/{1.73_m2} (ref >59)

## 2022-01-15 LAB — LIPASE: Lipase: 31 U/L (ref 14–72)

## 2022-01-22 ENCOUNTER — Emergency Department (HOSPITAL_COMMUNITY)
Admission: EM | Admit: 2022-01-22 | Discharge: 2022-01-23 | Disposition: A | Discharge status: Other Acute Inpatient Hospital | Payer: No Typology Code available for payment source | Attending: Emergency Medicine | Admitting: Emergency Medicine

## 2022-01-22 ENCOUNTER — Other Ambulatory Visit: Payer: Self-pay

## 2022-01-22 ENCOUNTER — Encounter: Payer: Self-pay | Admitting: Critical Care Medicine

## 2022-01-22 ENCOUNTER — Ambulatory Visit
Payer: No Typology Code available for payment source | Attending: Critical Care Medicine | Admitting: Critical Care Medicine

## 2022-01-22 VITALS — BP 117/81 | HR 59 | Wt 132.8 lb

## 2022-01-22 DIAGNOSIS — Z20822 Contact with and (suspected) exposure to covid-19: Secondary | ICD-10-CM | POA: Insufficient documentation

## 2022-01-22 DIAGNOSIS — R1011 Right upper quadrant pain: Secondary | ICD-10-CM | POA: Diagnosis not present

## 2022-01-22 DIAGNOSIS — Z9189 Other specified personal risk factors, not elsewhere classified: Secondary | ICD-10-CM | POA: Diagnosis not present

## 2022-01-22 DIAGNOSIS — F331 Major depressive disorder, recurrent, moderate: Secondary | ICD-10-CM | POA: Diagnosis not present

## 2022-01-22 DIAGNOSIS — R109 Unspecified abdominal pain: Secondary | ICD-10-CM | POA: Insufficient documentation

## 2022-01-22 DIAGNOSIS — Y900 Blood alcohol level of less than 20 mg/100 ml: Secondary | ICD-10-CM | POA: Insufficient documentation

## 2022-01-22 DIAGNOSIS — Z046 Encounter for general psychiatric examination, requested by authority: Secondary | ICD-10-CM | POA: Diagnosis present

## 2022-01-22 DIAGNOSIS — F419 Anxiety disorder, unspecified: Secondary | ICD-10-CM

## 2022-01-22 DIAGNOSIS — F339 Major depressive disorder, recurrent, unspecified: Secondary | ICD-10-CM

## 2022-01-22 DIAGNOSIS — R45851 Suicidal ideations: Secondary | ICD-10-CM | POA: Diagnosis not present

## 2022-01-22 DIAGNOSIS — R4589 Other symptoms and signs involving emotional state: Secondary | ICD-10-CM

## 2022-01-22 LAB — CBC WITH DIFFERENTIAL/PLATELET
Abs Immature Granulocytes: 0.02 10*3/uL (ref 0.00–0.07)
Basophils Absolute: 0 10*3/uL (ref 0.0–0.1)
Basophils Relative: 0 %
Eosinophils Absolute: 0.1 10*3/uL (ref 0.0–0.5)
Eosinophils Relative: 1 %
HCT: 37.1 % (ref 36.0–46.0)
Hemoglobin: 12.8 g/dL (ref 12.0–15.0)
Immature Granulocytes: 0 %
Lymphocytes Relative: 21 %
Lymphs Abs: 1.1 10*3/uL (ref 0.7–4.0)
MCH: 32.7 pg (ref 26.0–34.0)
MCHC: 34.5 g/dL (ref 30.0–36.0)
MCV: 94.6 fL (ref 80.0–100.0)
Monocytes Absolute: 0.3 10*3/uL (ref 0.1–1.0)
Monocytes Relative: 6 %
Neutro Abs: 3.8 10*3/uL (ref 1.7–7.7)
Neutrophils Relative %: 72 %
Platelets: 219 10*3/uL (ref 150–400)
RBC: 3.92 MIL/uL (ref 3.87–5.11)
RDW: 12.3 % (ref 11.5–15.5)
WBC: 5.3 10*3/uL (ref 4.0–10.5)
nRBC: 0 % (ref 0.0–0.2)

## 2022-01-22 LAB — RAPID URINE DRUG SCREEN, HOSP PERFORMED
Amphetamines: NOT DETECTED
Barbiturates: NOT DETECTED
Benzodiazepines: NOT DETECTED
Cocaine: NOT DETECTED
Opiates: NOT DETECTED
Tetrahydrocannabinol: NOT DETECTED

## 2022-01-22 LAB — COMPREHENSIVE METABOLIC PANEL WITH GFR
ALT: 13 U/L (ref 0–44)
AST: 16 U/L (ref 15–41)
Albumin: 4.1 g/dL (ref 3.5–5.0)
Alkaline Phosphatase: 65 U/L (ref 38–126)
Anion gap: 6 (ref 5–15)
BUN: 11 mg/dL (ref 6–20)
CO2: 25 mmol/L (ref 22–32)
Calcium: 9.3 mg/dL (ref 8.9–10.3)
Chloride: 109 mmol/L (ref 98–111)
Creatinine, Ser: 0.52 mg/dL (ref 0.44–1.00)
GFR, Estimated: >60 mL/min
Glucose, Bld: 95 mg/dL (ref 70–99)
Potassium: 3.7 mmol/L (ref 3.5–5.1)
Sodium: 140 mmol/L (ref 135–145)
Total Bilirubin: 0.9 mg/dL (ref 0.3–1.2)
Total Protein: 7.5 g/dL (ref 6.5–8.1)

## 2022-01-22 LAB — HCG, QUANTITATIVE, PREGNANCY: hCG, Beta Chain, Quant, S: <1 m[IU]/mL (ref <5)

## 2022-01-22 LAB — SALICYLATE LEVEL: Salicylate Lvl: <7 mg/dL — ABNORMAL LOW (ref 7.0–30.0)

## 2022-01-22 LAB — ETHANOL: Alcohol, Ethyl (B): <10 mg/dL (ref <10)

## 2022-01-22 LAB — ACETAMINOPHEN LEVEL: Acetaminophen (Tylenol), Serum: <10 ug/mL — ABNORMAL LOW (ref 10–30)

## 2022-01-22 MED ORDER — ACETAMINOPHEN 325 MG PO TABS
650.0000 mg | ORAL_TABLET | Freq: Four times a day (QID) | ORAL | Status: DC | PRN
  Administered 2022-01-22 – 2022-01-23 (×2): 650 mg via ORAL
  Filled 2022-01-22 (×2): qty 2

## 2022-01-22 MED ORDER — IBUPROFEN 200 MG PO TABS
600.0000 mg | ORAL_TABLET | Freq: Four times a day (QID) | ORAL | Status: DC | PRN
  Administered 2022-01-22: 600 mg via ORAL
  Filled 2022-01-22: qty 3

## 2022-01-22 NOTE — ED Notes (Signed)
Patient requested med for HA. Notified Dr. Darl Householder. New order for tylenol ordered and given. Will continue to monitor.

## 2022-01-22 NOTE — ED Triage Notes (Signed)
Patient is A&O x4. Patient was at a doctors appointment and expressed suicidal ideations with a plan. They suggested she go to the hospital. [employer] transported her here. She is voluntary.

## 2022-01-22 NOTE — Assessment & Plan Note (Signed)
Patient with active suicidal ideation and major depression.  Patient asking for antidepressants but I am not confident in sending her out on oral medication for fear she might commit suicide again by taking medications in an overdose.  Patient's by herself today she drove herself to the clinic.  I cannot get her to contract for safety driving herself to the emergency room.  She is an adult and wanted me to call her partner.  Her partner was called she cannot get off work which will meet her at the emergency room.  Because I could not contract for safety I did call [facility] police department who did agree to transport her safely to the emergency room for evaluation.  I also called the emergency room and let them know she is coming.

## 2022-01-22 NOTE — Assessment & Plan Note (Signed)
Patient's had recurrent major depressive disorder and has had self-harming behavior in the past we will need significant psychiatric intervention here
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
## 2022-01-22 NOTE — Assessment & Plan Note (Signed)
Patient with a history of self-harm and is at high risk for self harming himself now she was taken to the emergency room by the police department voluntarily for assessment
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
## 2022-01-22 NOTE — ED Notes (Signed)
Patient stated that her significant other, Vashti Hey, listed in contacts can receive any information regarding her and her care here at [facility].

## 2022-01-22 NOTE — Assessment & Plan Note (Signed)
The patient would benefit from imaging however at this time I am not comfortable proceeding with any type of outpatient work-up and she is assessed and cleared by mental health

## 2022-01-22 NOTE — Patient Instructions (Signed)
You are being sent to [facility] by police escort for suicide ideation and evaluation
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
## 2022-01-23 ENCOUNTER — Encounter (HOSPITAL_COMMUNITY): Payer: Self-pay | Admitting: Psychiatry

## 2022-01-23 ENCOUNTER — Inpatient Hospital Stay (HOSPITAL_COMMUNITY)
Admission: AD | Admit: 2022-01-23 | Discharge: 2022-01-25 | DRG: 885 | Disposition: A | Discharge status: Home / Self Care | Payer: No Typology Code available for payment source | Source: Intra-hospital | Attending: Psychiatry | Admitting: Psychiatry

## 2022-01-23 ENCOUNTER — Other Ambulatory Visit: Payer: Self-pay

## 2022-01-23 DIAGNOSIS — F17200 Nicotine dependence, unspecified, uncomplicated: Secondary | ICD-10-CM | POA: Diagnosis present

## 2022-01-23 DIAGNOSIS — G479 Sleep disorder, unspecified: Secondary | ICD-10-CM | POA: Diagnosis present

## 2022-01-23 DIAGNOSIS — F339 Major depressive disorder, recurrent, unspecified: Secondary | ICD-10-CM | POA: Diagnosis not present

## 2022-01-23 DIAGNOSIS — Z8619 Personal history of other infectious and parasitic diseases: Secondary | ICD-10-CM

## 2022-01-23 DIAGNOSIS — F332 Major depressive disorder, recurrent severe without psychotic features: Principal | ICD-10-CM | POA: Diagnosis present

## 2022-01-23 DIAGNOSIS — F4310 Post-traumatic stress disorder, unspecified: Secondary | ICD-10-CM | POA: Diagnosis present

## 2022-01-23 DIAGNOSIS — Z79899 Other long term (current) drug therapy: Secondary | ICD-10-CM

## 2022-01-23 DIAGNOSIS — Z62810 Personal history of physical and sexual abuse in childhood: Secondary | ICD-10-CM | POA: Diagnosis present

## 2022-01-23 DIAGNOSIS — F410 Panic disorder [episodic paroxysmal anxiety] without agoraphobia: Secondary | ICD-10-CM | POA: Diagnosis present

## 2022-01-23 DIAGNOSIS — Z9189 Other specified personal risk factors, not elsewhere classified: Secondary | ICD-10-CM

## 2022-01-23 DIAGNOSIS — Z818 Family history of other mental and behavioral disorders: Secondary | ICD-10-CM

## 2022-01-23 LAB — SARS CORONAVIRUS 2 BY RT PCR: SARS Coronavirus 2 by RT PCR: NEGATIVE

## 2022-01-23 MED ORDER — ACETAMINOPHEN 325 MG PO TABS
650.0000 mg | ORAL_TABLET | Freq: Four times a day (QID) | ORAL | Status: DC | PRN
  Administered 2022-01-24: 650 mg via ORAL
  Filled 2022-01-23: qty 2

## 2022-01-23 MED ORDER — ALUM & MAG HYDROXIDE-SIMETH 200-200-20 MG/5ML PO SUSP: 30.0000 mL | ORAL | Status: DC | PRN

## 2022-01-23 MED ORDER — HYDROXYZINE HCL 25 MG PO TABS: 25.0000 mg | ORAL_TABLET | Freq: Three times a day (TID) | ORAL | Status: DC | PRN

## 2022-01-23 MED ORDER — MAGNESIUM HYDROXIDE 400 MG/5ML PO SUSP: 30.0000 mL | Freq: Every day | ORAL | Status: DC | PRN

## 2022-01-23 MED ORDER — TRAZODONE HCL 50 MG PO TABS
50.0000 mg | ORAL_TABLET | Freq: Every evening | ORAL | Status: DC | PRN
  Administered 2022-01-24: 50 mg via ORAL
  Filled 2022-01-23: qty 1

## 2022-01-23 NOTE — ED Notes (Signed)
Patient has visitor at bedside. 

## 2022-01-23 NOTE — Progress Notes (Signed)
Admission Note:  D: Pt is alert and oriented x 4. Calm and cooperative during assessment. Pt  denies SI/HI/AVH and pain. Patient appears to have passive SI. Pt resides in [facility]. Pt identifies as a lesbian female and endorses her partner as her support system.      A: Pt admitted to unit per protocol, skin assessment and search done and no contraband found.  Pt  educated on therapeutic milieu rules. Pt was introduced to milieu by nursing staff. All questions and concerns were addressed and answered during assessment.   R: Pt was receptive to education about the milieu . 15 min safety checks started. Clinical research associate offered support

## 2022-01-23 NOTE — ED Notes (Signed)
Patient awake. Alert, calm and cooperative.

## 2022-01-23 NOTE — ED Notes (Signed)
Patient's HA came back 7/10 per patient and too early to give tylenol and ibuprofen ordered and given earlier by Dr. Silverio Lay. Notified Dr. Madilyn Hook. She stated it's okay to give the tylenol now. Will continue to monitor.

## 2022-01-23 NOTE — BH Assessment (Addendum)
[facility] Assessment Progress Note   Per Dahlia Byes, NP, this pt requires psychiatric hospitalization at this time.  Brook, RN, [facility] has assigned pt to [facility] Rm 405-2 to the service of Dr Sherron Flemings.  He will call when they are ready to receive pt.  Pt has signed Voluntary Admission and Consent for Treatment and signed form has been faxed to [facility].  EDP Meridee Score, MD, Serena Colonel, NP and pt's nurse, Samara Deist, have been notified, and Samara Deist agrees to send original paperwork along with pt via [facility], and to call report to [phone].  Doylene Canning, [place] [facility]al [facility] Coordinator [phone]  Addendum:  Per Debbe Bales, [facility] will be ready to receive pt after 15:00.  Parties above have been notified.  Doylene Canning, [place] [facility] Health Coordinator [phone]

## 2022-01-23 NOTE — [facility] Group Notes (Signed)
Patient attended and participated in the NA group. ?

## 2022-01-23 NOTE — ED Notes (Signed)
RN attempted to call report to [facility]. RN told to call back after 3 to give report.

## 2022-01-23 NOTE — ED Provider Notes (Signed)
Emergency Medicine Observation Re-evaluation Note  Lori Ponce is a 26 y.o. female, seen on rounds today.  Pt initially presented to the ED for complaints of Suicidal Currently, the patient is awake and alert.  Physical Exam  BP 103/61 (BP Location: Left Arm)   Pulse (!) 58   Temp (!) 97.5 F (36.4 C) (Oral)   Resp 16   Ht 5\' 2"  (1.575 m)   Wt 59.9 kg   SpO2 100%   BMI 24.14 kg/m  Physical Exam General: No acute distress Cardiac: Well-perfused Lungs: Nonlabored Psych: Cooperative  ED Course / MDM  EKG:   I have reviewed the labs performed to date as well as medications administered while in observation.  Recent changes in the last 24 hours include psychiatric assessment.  Plan  Current plan is for placement at [facility].  Lori Ponce is not under involuntary commitment.  10:30 AM.  Patient has been accepted by Dr. Daryll Drown to [facility].  Transport by safe transport.     [facility], MD 01/23/22 [phone]

## 2022-01-23 NOTE — ED Notes (Signed)
Patient sleeping at this time. Will do VSs when she wakes up.

## 2022-01-23 NOTE — ED Notes (Signed)
Safe transport called to transport patient to [facility]. 

## 2022-01-23 NOTE — ED Notes (Signed)
RN attempted report to [facility] states they will have the admission nurse call back.

## 2022-01-23 NOTE — ED Notes (Signed)
Patient escorted to safe transport. Belongings and paperwork given to driver.

## 2022-01-23 NOTE — ED Notes (Signed)
Patient's father at bedside

## 2022-01-24 ENCOUNTER — Telehealth: Payer: Self-pay | Admitting: Critical Care Medicine

## 2022-01-24 LAB — TSH: TSH: 2.859 u[IU]/mL (ref 0.350–4.500)

## 2022-01-24 LAB — LIPID PANEL
Cholesterol: 167 mg/dL (ref 0–200)
HDL: 63 mg/dL (ref >40)
LDL Cholesterol: 87 mg/dL (ref 0–99)
Total CHOL/HDL Ratio: 2.7 RATIO
Triglycerides: 84 mg/dL (ref <150)
VLDL: 17 mg/dL (ref 0–40)

## 2022-01-24 LAB — HEMOGLOBIN A1C
Hgb A1c MFr Bld: 4.8 % (ref 4.8–5.6)
Mean Plasma Glucose: 91.06 mg/dL

## 2022-01-24 MED ORDER — NICOTINE POLACRILEX 2 MG MT GUM: 2.0000 mg | CHEWING_GUM | OROMUCOSAL | Status: DC | PRN

## 2022-01-24 MED ORDER — ONDANSETRON 4 MG PO TBDP
4.0000 mg | ORAL_TABLET | Freq: Once | ORAL | Status: AC
  Administered 2022-01-24: 4 mg via ORAL
  Filled 2022-01-24 (×2): qty 1

## 2022-01-24 MED ORDER — PROPRANOLOL HCL 10 MG PO TABS
10.0000 mg | ORAL_TABLET | Freq: Two times a day (BID) | ORAL | Status: DC
  Administered 2022-01-24 – 2022-01-25 (×2): 10 mg via ORAL
  Filled 2022-01-24 (×6): qty 1

## 2022-01-24 MED ORDER — LORATADINE 10 MG PO TABS
10.0000 mg | ORAL_TABLET | Freq: Every day | ORAL | Status: DC
  Administered 2022-01-24 – 2022-01-25 (×2): 10 mg via ORAL
  Filled 2022-01-24 (×4): qty 1

## 2022-01-24 NOTE — Progress Notes (Signed)
D:  Patient's self inventory sheet, patient has poor sleep, no sleep medication.  Good appetite, normal energy level, good concentration.  Denied depression and hopeless, anxiety #4.  Denied withdrawals.  Denied SI.  Physical problems, pain, lower back, worst pain #6 in past 24 hours.  Goal is talk to daughter.  Plans to call mother to talk to daughter.  Saturday is graduation party for daughter and wants to attend.  Does have discharge plans. A:  Medications administered per MD orders.  Emotional support and encouragement given patient. R:  Denied SI and HI, contracts for safety.  Denied A/V hallucinations.  Safety maintained with 15 minute checks.
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
## 2022-01-24 NOTE — [facility] Group Notes (Signed)
[facility] Group Notes:  (Nursing/MHT/Case Management/Adjunct)  Date:  01/24/2022  Time:  11:44 AM  Type of Therapy:  Group Therapy  Participation Level:  Active  Participation Quality:  Appropriate  Affect:  Appropriate  Cognitive:  Appropriate  Insight:  Appropriate  Engagement in Group:  Engaged  Modes of Intervention:  Discussion  Summary of Progress/Problems:  Patient attended a check in group to see what brings them joy and motivates them and was asked a couple questions.  What is something in your life that gives you great joy and meaning?  What is something that motivates you? How has your perspective on life changed as you've gotten older?  My child Staying sober Knowing that big friend groups are not always the best and to stay close to the ones that matter.   Daneil Dan 01/24/2022, 11:44 AM

## 2022-01-24 NOTE — Plan of Care (Signed)
Nurse discussed coping skills with patient.  

## 2022-01-24 NOTE — Group Note (Signed)
Date:  01/24/2022 Time:  9:11 AM  Group Topic/Focus:  Orientation:   The focus of this group is to educate the patient on the purpose and policies of crisis stabilization and provide a format to answer questions about their admission.  The group details unit policies and expectations of patients while admitted.    Participation Level:  Active  Participation Quality:  Appropriate  Affect:  Appropriate  Cognitive:  Appropriate  Insight: Appropriate  Engagement in Group:  Engaged  Modes of Intervention:  Discussion  Additional Comments:    Jaquita Rector 01/24/2022, 9:11 AM

## 2022-01-24 NOTE — Telephone Encounter (Signed)
Copied from CRM [id number]. Topic: Appointment Scheduling - Scheduling Inquiry for Clinic >> Jan 24, 2022  3:50 PM Lori Ponce wrote: Reason for CRM: Patient is currently admitted to [facility] hosp but they called today to schedule a hosp follow-up with Dr. Delford Field also asking if he would take over her [facility] medication management. Agent who received the called advised the hosp staff member (never identified/confirmed) that his next available is Aug but [name] has opening for post hosp July 20.   Please call patient to schedule a hosp follow-up sooner than 7/20.

## 2022-01-24 NOTE — [facility] Group Notes (Signed)
[facility] Group Notes:  (Nursing/MHT/Case Management/Adjunct)  Date:  01/24/2022  Time:  11:01 AM  Type of Therapy:  Group Therapy: Goals Group  Participation Level:  Active  Participation Quality:  Appropriate  Affect:  Appropriate  Cognitive:  Appropriate  Insight:  Appropriate  Engagement in Group:  Engaged  Modes of Intervention:  Discussion  Summary of Progress/Problems:  Patient attended and participated in goals group today. Patient's goal for today is to leave tomorrow so that she can attend her daughter's graduation.   Osvaldo Human R Kaaren Nass 01/24/2022, 11:01 AM
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
## 2022-01-25 ENCOUNTER — Encounter (HOSPITAL_COMMUNITY): Payer: Self-pay

## 2022-01-25 ENCOUNTER — Other Ambulatory Visit: Payer: Self-pay

## 2022-01-25 DIAGNOSIS — F339 Major depressive disorder, recurrent, unspecified: Secondary | ICD-10-CM

## 2022-01-25 MED ORDER — TRAZODONE HCL 50 MG PO TABS
50.0000 mg | ORAL_TABLET | Freq: Every evening | ORAL | 0 refills | Status: DC | PRN
  Filled 2022-01-25: qty 30, 30d supply, fill #0

## 2022-01-25 MED ORDER — PROPRANOLOL HCL 10 MG PO TABS
10.0000 mg | ORAL_TABLET | Freq: Two times a day (BID) | ORAL | 0 refills | Status: DC
  Filled 2022-01-25: qty 60, 30d supply, fill #0

## 2022-01-25 MED ORDER — NICOTINE POLACRILEX 2 MG MT GUM
2.0000 mg | CHEWING_GUM | OROMUCOSAL | 0 refills | Status: DC | PRN
  Filled 2022-01-25: qty 50, 50d supply, fill #0

## 2022-01-25 NOTE — [facility] Suicide Risk Assessment (Signed)
[facility] INPATIENT:  Family/Significant Other Suicide Prevention Education  Suicide Prevention Education:  Education Completed; Perrin Maltese [phone] (Partner) has been identified by the patient as the family member/significant other with whom the patient will be residing, and identified as the person(s) who will aid the patient in the event of a mental health crisis (suicidal ideations/suicide attempt).  With written consent from the patient, the family member/significant other has been provided the following suicide prevention education, prior to the and/or following the discharge of the patient.  The suicide prevention education provided includes the following: Suicide risk factors Suicide prevention and interventions National Suicide Hotline telephone number [facility] assessment telephone number [facility] Emergency Assistance 911 [facility] and/or Residential Mobile Crisis Unit telephone number  Request made of family/significant other to: Remove weapons (e.g., guns, rifles, knives), all items previously/currently identified as safety concern.   Remove drugs/medications (over-the-counter, prescriptions, illicit drugs), all items previously/currently identified as a safety concern.  The family member/significant other verbalizes understanding of the suicide prevention education information provided.  The family member/significant other agrees to remove the items of safety concern listed above.  CSW spoke with Ms. Harriston who confirms that her partner is living in an [address] and can return there.  She states that there are no firearms or weapons in her partners possession.  She states that she has no concerns or questions at this time.  CSW completed SPE with Ms. Harriston.   [place] Kung Benn Tarver 01/25/2022, 10:43 AM

## 2022-01-25 NOTE — BH IP Treatment Plan (Signed)
Interdisciplinary Treatment and Diagnostic Plan Update  01/25/2022 Time of Session: 10:45am Lori Ponce MRN: [id number]  Principal Diagnosis: Major depressive disorder, recurrent episode with anxious distress ([place])  Secondary Diagnoses: Principal Problem:   Major depressive disorder, recurrent episode with anxious distress ([name]) Active Problems:   PTSD (post-traumatic stress disorder)   At risk for intentional self-harm   Current Medications:  Current Facility-Administered Medications  Medication Dose Route Frequency Provider Last Rate Last Admin   acetaminophen (TYLENOL) tablet 650 mg  650 mg Oral Q6H PRN Lindon Romp A, NP   650 mg at 01/24/22 2134   alum & mag hydroxide-simeth (MAALOX/MYLANTA) 200-200-20 MG/5ML suspension 30 mL  30 mL Oral Q4H PRN Rozetta Nunnery, NP       hydrOXYzine (ATARAX) tablet 25 mg  25 mg Oral TID PRN Rozetta Nunnery, NP       loratadine (CLARITIN) tablet 10 mg  10 mg Oral Daily Damita Dunnings B, MD   10 mg at 01/25/22 0755   magnesium hydroxide (MILK OF MAGNESIA) suspension 30 mL  30 mL Oral Daily PRN Lindon Romp A, NP       nicotine polacrilex (NICORETTE) gum 2 mg  2 mg Oral PRN Damita Dunnings B, MD       propranolol (INDERAL) tablet 10 mg  10 mg Oral BID Damita Dunnings B, MD   10 mg at 01/25/22 0755   traZODone (DESYREL) tablet 50 mg  50 mg Oral QHS PRN Rozetta Nunnery, NP   50 mg at 01/24/22 2132   PTA Medications: Medications Prior to Admission  Medication Sig Dispense Refill Last Dose   cetirizine (ZYRTEC) 10 MG tablet Take 10 mg by mouth daily.      fluticasone (FLONASE) 50 MCG/ACT nasal spray Place 2 sprays into both nostrils daily. (Patient taking differently: Place 2 sprays into both nostrils daily as needed for allergies.) 16 g 6    ketorolac (TORADOL) 10 MG tablet Take 1 tablet (10 mg total) by mouth every 6 (six) hours as needed. (Patient not taking: Reported on 01/22/2022) 20 tablet 0     Patient Stressors:    Patient Strengths:     Treatment Modalities: Medication Management, Group therapy, Case management,  1 to 1 session with clinician, Psychoeducation, Recreational therapy.   Physician Treatment Plan for Primary Diagnosis: Major depressive disorder, recurrent episode with anxious distress ([name]) Long Term Goal(s): Improvement in symptoms so as ready for discharge   Short Term Goals: Ability to identify changes in lifestyle to reduce recurrence of condition will improve Ability to identify and develop effective coping behaviors will improve Ability to verbalize feelings will improve  Medication Management: Evaluate patient's response, side effects, and tolerance of medication regimen.  Therapeutic Interventions: 1 to 1 sessions, Unit Group sessions and Medication administration.  Evaluation of Outcomes: Met  Physician Treatment Plan for Secondary Diagnosis: Principal Problem:   Major depressive disorder, recurrent episode with anxious distress ([name]) Active Problems:   PTSD (post-traumatic stress disorder)   At risk for intentional self-harm  Long Term Goal(s): Improvement in symptoms so as ready for discharge   Short Term Goals: Ability to identify changes in lifestyle to reduce recurrence of condition will improve Ability to identify and develop effective coping behaviors will improve Ability to verbalize feelings will improve     Medication Management: Evaluate patient's response, side effects, and tolerance of medication regimen.  Therapeutic Interventions: 1 to 1 sessions, Unit Group sessions and Medication administration.  Evaluation of Outcomes: Adequate for Discharge  RN Treatment Plan for Primary Diagnosis: Major depressive disorder, recurrent episode with anxious distress ([name]) Long Term Goal(s): Knowledge of disease and therapeutic regimen to maintain health will improve  Short Term Goals: Ability to remain free from injury will improve, Ability to verbalize frustration and anger  appropriately will improve, Ability to demonstrate self-control, Ability to participate in decision making will improve, Ability to verbalize feelings will improve, Ability to disclose and discuss suicidal ideas, Ability to identify and develop effective coping behaviors will improve, and Compliance with prescribed medications will improve  Medication Management: RN will administer medications as ordered by provider, will assess and evaluate patient's response and provide education to patient for prescribed medication. RN will report any adverse and/or side effects to prescribing provider.  Therapeutic Interventions: 1 on 1 counseling sessions, Psychoeducation, Medication administration, Evaluate responses to treatment, Monitor vital signs and CBGs as ordered, Perform/monitor CIWA, COWS, AIMS and Fall Risk screenings as ordered, Perform wound care treatments as ordered.  Evaluation of Outcomes: Adequate for Discharge   LCSW Treatment Plan for Primary Diagnosis: Major depressive disorder, recurrent episode with anxious distress ([name]) Long Term Goal(s): Safe transition to appropriate next level of care at discharge, Engage patient in therapeutic group addressing interpersonal concerns.  Short Term Goals: Engage patient in aftercare planning with referrals and resources, Increase social support, Increase ability to appropriately verbalize feelings, Increase emotional regulation, Facilitate acceptance of mental health diagnosis and concerns, Facilitate patient progression through stages of change regarding substance use diagnoses and concerns, Identify triggers associated with mental health/substance abuse issues, and Increase skills for wellness and recovery  Therapeutic Interventions: Assess for all discharge needs, 1 to 1 time with Social worker, Explore available resources and support systems, Assess for adequacy in community support network, Educate family and significant other(s) on suicide  prevention, Complete Psychosocial Assessment, Interpersonal group therapy.  Evaluation of Outcomes: Adequate for Discharge   Progress in Treatment: Attending groups: Yes. Participating in groups: Yes. Taking medication as prescribed: Yes. Toleration medication: Yes. Family/Significant other contact made: Yes, individual(s) contacted:  Louretta Parma, partner Patient understands diagnosis: Yes. Discussing patient identified problems/goals with staff: Yes. Medical problems stabilized or resolved: Yes. Denies suicidal/homicidal ideation: Yes. Issues/concerns per patient self-inventory: No.   New problem(s) identified: No, Describe:  none reported  New Short Term/Long Term Goal(s): detox, medication management for mood stabilization; elimination of SI thoughts; development of comprehensive mental wellness/sobriety plan    Patient Goals:  Patient states, " I wanted gain insight on ways to address my mental health. Is this something medication can fix or do I just need better ways to cope?"  Discharge Plan or Barriers: Patient connected with med management and therapy appts.  Patient also connected with PCP appt.   Reason for Continuation of Hospitalization: Other; describe patient is being discharged  Estimated Length of Stay: 0-1 day  Last [place] Suicide Severity Risk Score: [place] Admission (Current) from 01/23/2022 in [facility] ED from 01/14/2022 in [facility] [place] [facility] from 10/30/2021 in [facility] [place] No Risk No Risk No Risk       Last PHQ 2/9 Scores:    01/22/2022    2:30 PM 07/19/2021    8:59 AM 04/09/2021    2:25 PM  Depression screen PHQ 2/9  Decreased Interest 2 0 0  Down, Depressed, Hopeless 1 0 0  PHQ - 2 Score 3 0 0  Altered  sleeping 1  0  Tired, decreased energy 1  0  Change in appetite 3  3  Feeling bad or failure about yourself  2  3   Trouble concentrating 1  1  Moving slowly or fidgety/restless 0  0  Suicidal thoughts 2  0  PHQ-9 Score 13  7  Difficult doing work/chores   Not difficult at all    Scribe for Treatment Team: Zachery Conch, LCSW 01/25/2022 3:30 PM

## 2022-01-25 NOTE — Progress Notes (Signed)
Lori Ponce  D/C'd Home per MD order.  Discussed with the patient and all questions fully answered.  Skin clean, dry and intact without evidence of skin break down, no evidence of skin tears noted.   An After Visit Summary was printed and given to the patient. Patient received prescription information tyo pick up medication at the pharmacy.  D/c education completed with patient including follow up instructions, medication list, d/c activities limitations if indicated, with other d/c instructions as indicated by MD - patient able to verbalize understanding, all questions fully answered.   Patient instructed to return to ED, call 911, or call MD for any changes in condition.   Patient escorted to the main entrance, and D/C home via private auto.  [name] 01/25/2022 3:34 PM

## 2022-01-25 NOTE — Progress Notes (Signed)
Pt rates depression 2/10 and anxiety 2/10. Pt reports a good appetite. Pt reports feeling nauseous and rates it 5/10. Pt also reports headaches and rates it 7/10. Pt given PRNs. Pt denies SI/HI/AVH and verbally contracts for safety. Provided support and encouragement. Pt safe on the unit. Q 15 minute safety checks continued.
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
## 2022-01-25 NOTE — Discharge Summary (Signed)
Physician Discharge Summary Note  Patient:  Lori Ponce is an 26 y.o., female MRN:  [id number] DOB:  10/16/1995 Patient phone:  [phone] (home)  Patient address:   [address] Lori Ponce [place] [address],  Total Time spent with patient: 30 minutes  Date of Admission:  01/23/2022 Date of Discharge: 01/25/2022  Reason for Admission:  Lori Ponce is a 26yo patient w/ PPH of Etoh Use disorder, MDD and GAD who presented to [name] after her PCP became concerned by her responses in a screening panel. Patient endorsed a farily recent hx of intention to self-harm via burning and had a hx of SA attempts.In the [facility] patient was declared medically stable and transferred to [name].   Principal Problem: Major depressive disorder, recurrent episode with anxious distress [facility]) Discharge Diagnoses: Principal Problem:   Major depressive disorder, recurrent episode with anxious distress (HCC) Active Problems:   PTSD (post-traumatic stress disorder)   At risk for intentional self-harm   Past Psychiatric History:   Prior hospitalizations:  -01/2021 at [facility] for MDD and Etoh use and OD attempt suicide: dc'd on Trazodone 50mg  QHS,  -04/2020- MDD: dc'd on Lexapro;  -03/2013- [facility] dx PTSD and MDD dc'd on Klonopin 0.5mg  BID, Lexapro 10mg , Seroquel 25mg  QHS, Effexor XR 150mg    Patient has hx of OUT PT psych providers No current OUTPT provider Current OUTPT therapist at: [facility] No medications last 2 years   Other than above past medications: Lamictal, Buspar and Zoloft. Patient reports she never really found benefit to any of the meds, but the longest she was on any medication was 3 mon. And this was Prozac.   Past Medical History:  Past Medical History:  Diagnosis Date   Influenza A 07/19/2021    Past Surgical History:  Procedure Laterality Date   NO PAST SURGERIES     Family History:  Family History  Problem Relation Age of Onset   Diabetes Maternal Grandmother     Diabetes Maternal Grandfather    Family Psychiatric  History: Mom: Depression Social History:  Social History   Substance and Sexual Activity  Alcohol Use Yes   Comment: occasionally     Social History   Substance and Sexual Activity  Drug Use Yes   Frequency: 2.0 times per week   Types: Marijuana    Social History   Socioeconomic History   Marital status: Single    Spouse name: Not on file   Number of children: 1   Years of education: 10 th grade   Highest education level: Not on file  Occupational History   Not on file  Tobacco Use   Smoking status: Never   Smokeless tobacco: Never  Vaping Use   Vaping Use: Never used  Substance and Sexual Activity   Alcohol use: Yes    Comment: occasionally   Drug use: Yes    Frequency: 2.0 times per week    Types: Marijuana   Sexual activity: Yes    Birth control/protection: None  Other Topics Concern   Not on file  Social History Narrative   Not on file   Social Determinants of Health   Financial Resource Strain: Not on file  Food Insecurity: Not on file  Transportation Needs: Not on file  Physical Activity: Not on file  Stress: Not on file  Social Connections: Not on file    Hospital Course:  Via Lori Ponce is a 26yo patient w/ PPH of Etoh Use disorder, MDD and GAD who presented to [name] after  her PCP became concerned by her responses in a screening panel. Patient endorsed a farily recent hx of intention to self-harm via burning and had a hx of SA attempts.In the [name] patient was declared medically stable and transferred to [facility].   During the patient's hospitalization, patient had extensive initial psychiatric evaluation, and follow-up psychiatric evaluations every day.   Psychiatric diagnoses provided upon initial assessment: PTSD MDD, recurrent, episode, severe w/o psychosis Tobacco use disorder   Patient's psychiatric medications were adjusted on admission:  -- Started on Propanolol 10mg  BID   During the  hospitalization, other adjustments were made to the patient's psychiatric medication regimen: NONE   Gradually, patient started adjusting to milieu.   Patient's care was discussed during the interdisciplinary team meeting every day during the hospitalization.   The patient denied having side effects to prescribed psychiatric medication.   The patient reports their target psychiatric symptoms of hypervigilance responded well to the psychiatric medications, and the patient reports overall benefit other psychiatric hospitalization. Supportive psychotherapy was provided to the patient. The patient also participated in regular group therapy while admitted. Patient appreciated psychoeducation regarding her symptoms and felt this was most beneficial to understanding how to regulate her associated anxiety when she has triggers. Patient felt very supported by her family and friends.    Labs were reviewed with the patient, and abnormal results were discussed with the patient.   The patient denied having suicidal thoughts more than 48 hours prior to discharge.  Patient denies having homicidal thoughts.  Patient denies having auditory hallucinations.  Patient denies any visual hallucinations.  Patient denies having paranoid thoughts.   The patient is able to verbalize their individual safety plan to this provider.   It is recommended to the patient to continue psychiatric medications as prescribed, after discharge from the hospital.     It is recommended to the patient to follow up with your outpatient psychiatric provider and PCP.   Discussed with the patient, the impact of alcohol, drugs, tobacco have been there overall psychiatric and medical wellbeing, and total abstinence from substance use was recommended the patient.   Physical Findings: AIMS:  , ,  ,  ,    CIWA:    COWS:     Musculoskeletal: Strength & Muscle Tone: within normal limits Gait & Station: normal Patient leans:  N/A   Psychiatric Specialty Exam:  Presentation  General Appearance: Appropriate for Environment; Casual  Eye Contact:Good  Speech:Clear and Coherent  Speech Volume:Normal  Handedness:Right   Mood and Affect  Mood:Euthymic  Affect:Appropriate; Congruent   Thought Process  Thought Processes:Coherent  Descriptions of Associations:Intact  Orientation:Full (Time, Place and Person)  Thought Content:Logical  History of Schizophrenia/Schizoaffective disorder:No data recorded Duration of Psychotic Symptoms:No data recorded Hallucinations:Hallucinations: None  Ideas of Reference:None  Suicidal Thoughts:Suicidal Thoughts: No  Homicidal Thoughts:Homicidal Thoughts: No   Sensorium  Memory:Immediate Good; Recent Good  Judgment:Fair  Insight:Fair   Executive Functions  Concentration:Good  Attention Span:Good  Recall:Good  Fund of Knowledge:Good  Language:Good   Psychomotor Activity  Psychomotor Activity:Psychomotor Activity: Normal   Assets  Assets:Communication Skills; Desire for Improvement; Resilience; Housing; Social Support; Financial Resources/Insurance   Sleep  Sleep:Sleep: Fair    Physical Exam: Physical Exam HENT:     Head: Normocephalic and atraumatic.  Pulmonary:     Effort: Pulmonary effort is normal.  Neurological:     Mental Status: She is alert and oriented to person, place, and time.    Review of Systems  Psychiatric/Behavioral:  Negative for depression, hallucinations and suicidal ideas. The patient does not have insomnia.    Blood pressure 96/72, pulse 74, temperature 98.2 F (36.8 C), temperature source Oral, resp. rate 16, height 5\' 1"  (1.549 m), weight 59 kg, SpO2 100 %. Body mass index is 24.58 kg/m.   Social History   Tobacco Use  Smoking Status Never  Smokeless Tobacco Never   Tobacco Cessation:  A prescription for an FDA-approved tobacco cessation medication provided at discharge   Blood Alcohol level:   Lab Results  Component Value Date   ETH <10 01/22/2022    Metabolic Disorder Labs:  Lab Results  Component Value Date   HGBA1C 4.8 01/24/2022   MPG 91.06 01/24/2022   No results found for: "PROLACTIN" Lab Results  Component Value Date   CHOL 167 01/24/2022   TRIG 84 01/24/2022   HDL 63 01/24/2022   CHOLHDL 2.7 01/24/2022   VLDL 17 01/24/2022   LDLCALC 87 01/24/2022   LDLCALC 72 04/30/2021    See Psychiatric Specialty Exam and Suicide Risk Assessment completed by Attending Physician prior to discharge.  Discharge destination:  Home  Is patient on multiple antipsychotic therapies at discharge:  No   Has Patient had three or more failed trials of antipsychotic monotherapy by history:  No  Recommended Plan for Multiple Antipsychotic Therapies: NA  Discharge Instructions     Diet - low sodium heart healthy   Complete by: As directed    Increase activity slowly   Complete by: As directed    Increase activity slowly   Complete by: As directed       Allergies as of 01/25/2022   No Known Allergies      Medication List     STOP taking these medications    ketorolac 10 MG tablet Commonly known as: TORADOL       TAKE these medications      Indication  cetirizine 10 MG tablet Commonly known as: ZYRTEC Take 10 mg by mouth daily.  Indication: Allergic Conjunctivitis   fluticasone 50 MCG/ACT nasal spray Commonly known as: FLONASE Place 2 sprays into both nostrils daily. What changed:  when to take this reasons to take this  Indication: Allergic Rhinitis   nicotine polacrilex 2 MG gum Commonly known as: NICORETTE Take 1 each (2 mg total) by mouth as needed for smoking cessation.  Indication: Nicotine Addiction   propranolol 10 MG tablet Commonly known as: INDERAL Take 1 tablet (10 mg total) by mouth 2 (two) times daily.  Indication: Feeling Anxious   traZODone 50 MG tablet Commonly known as: DESYREL Take 1 tablet (50 mg total) by mouth at  bedtime as needed for sleep.  Indication: Trouble Sleeping        Follow-up Information     [facility]. Schedule an appointment as soon as possible for a visit.   Why: Please call to schedule an appointment for therapy services as soon as possible as we have been unable to reach this provider. Contact information: [place], [place]  P:  [phone]        [facility] Follow up on 03/28/2022.   Specialty: Behavioral Health Why: You have an appointment for medication mangement services on 03/28/22 at 9:00 am.  This appointment will be Virtual and the paperwork must be filled out and signed in person prior to the appointment, and as soon as possible.  You are on a cancellation list for a sooner appointment. Contact information: [address]  [address] [place] [address] [phone]        [facility] Follow up.   Specialty: Behavioral Health Why: You may also call to schedule an interim appointment for medication management services with this provider.  Prior to scheduling the appointment, a $100 fee is required to hold the appointment.  This fee will go towards your copays. Contact information: [address] [place] [address] [phone]         [facility]. Go on 02/28/2022.   Why: You have a hospital follow up appointment for medication management services with Lori Co, MD on 02/28/22 at 9:15 am.  This appointment will be held in person. Contact information: [address] [place] [address] [phone]                Follow-up recommendations:   Activity: as tolerated   Diet: heart healthy   Other: -Follow-up with your outpatient psychiatric provider -instructions on appointment date, time, and address (location) are provided to you in discharge paperwork.   -Take your psychiatric  medications as prescribed at discharge - instructions are provided to you in the discharge paperwork   -Follow-up with outpatient primary care doctor and other specialists    -Recommend abstinence from alcohol, tobacco, and other illicit drug use at discharge.    -If your psychiatric symptoms recur, worsen, or if you have side effects to your psychiatric medications, call your outpatient psychiatric provider, 911, [phone] or go to the nearest emergency department.   -If suicidal thoughts recur, call your outpatient psychiatric provider, 911, [phone] or go to the nearest emergency department.     Signed: PGY-2 Bobbye Morton, MD 01/25/2022, 10:55 AM

## 2022-01-25 NOTE — Progress Notes (Signed)
  [facility] Adult Case Management Discharge Plan :  Will you be returning to the same living situation after discharge:  Yes,  [facility]  At discharge, do you have transportation home?: Yes,  Partner  Do you have the ability to pay for your medications: Yes,  [employer]   Release of information consent forms completed and in the chart;  Patient's signature needed at discharge.  Patient to Follow up at:  Follow-up Information     [facility]. Schedule an appointment as soon as possible for a visit.   Why: Please call to schedule an appointment for therapy services as soon as possible as we have been unable to reach this provider. Contact information: [place], [place]  P:  [phone]        [facility] Follow up on 03/28/2022.   Specialty: Behavioral Health Why: You have an appointment for medication mangement services on 03/28/22 at 9:00 am.  This appointment will be Virtual and the paperwork must be filled out and signed in person prior to the appointment, and as soon as possible.  You are on a cancellation list for a sooner appointment. Contact information: [address] [place] [address] [phone]        [facility] Follow [facility].   Specialty: Behavioral Health Why: You may also call to schedule an interim appointment for medication management services with this provider.  Prior to scheduling the appointment, a $100 fee is required to hold the appointment.  This fee will go towards your copays. Contact information: [address] [place] [address] [phone]         [facility]. Go on 02/28/2022.   Why: You have a hospital follow up appointment for medication management services with Georgian Co, MD on 02/28/22 at 9:15 am.  This appointment will be held in person. Contact information: [address] [place] [address] [phone]                Next level of care provider has access to [facility] Link:yes  Safety Planning and Suicide Prevention discussed: Yes,  with patient and partner      Has patient been referred to the Quitline?: N/A patient is not a smoker  Patient has been referred for addiction treatment: Pt. refused referral  Pt reports being sober for 1 year.  Aram Beecham, LCSWA 01/25/2022, 9:27 AM

## 2022-01-25 NOTE — Group Note (Signed)
Recreation Therapy Group Note   Group Topic:Stress Management  Group Date: 01/25/2022 Start Time: 0930 End Time: 0950 Facilitators: Caroll Rancher, LRT,CTRS Location: [address] Dayroom   Goal Area(s) Addresses:  Patient will actively participate in stress management techniques presented during session.  Patient will successfully identify benefit of practicing stress management post d/c.   Group Description:  Guided Imagery. LRT provided education, instruction, and demonstration on practice of visualization via guided imagery. Patient was asked to participate in the technique introduced during session. LRT debriefed including topics of mindfulness, stress management and specific scenarios each patient could use these techniques. Patients were given suggestions of ways to access scripts post d/c and encouraged to explore Youtube and other apps available on smartphones, tablets, and computers.   Affect/Mood: Appropriate   Participation Level: Active   Participation Quality: Independent   Behavior: Appropriate   Speech/Thought Process: Focused   Insight: Good   Judgement: Good   Modes of Intervention: Script; Nature Sounds   Patient Response to Interventions:  Engaged   Education Outcome:  Acknowledges education and In group clarification offered    Clinical Observations/Individualized Feedback: Pt attended and participated in group session.     Plan: Continue to engage patient in RT group sessions 2-3x/week.   Caroll Rancher, Antonietta Jewel 01/25/2022 12:59 PM

## 2022-01-25 NOTE — Group Note (Signed)
LCSW Group Therapy Note   Group Date: 01/25/2022 Start Time: 1300 End Time: 1400  Type of Therapy and Topic:  Group Therapy - Healthy vs Unhealthy Coping Skills  Participation Level:  Did not attend    Description of Group The focus of this group was to determine what unhealthy coping techniques typically are used by group members and what healthy coping techniques would be helpful in coping with various problems. Patients were guided in becoming aware of the differences between healthy and unhealthy coping techniques. Patients were asked to identify 2-3 healthy coping skills they would like to learn to use more effectively.  Therapeutic Goals Patients learned that coping is what human beings do all day long to deal with various situations in their lives Patients defined and discussed healthy vs unhealthy coping techniques Patients identified their preferred coping techniques and identified whether these were healthy or unhealthy Patients determined 2-3 healthy coping skills they would like to become more familiar with and use more often. Patients provided support and ideas to each other   Summary of Patient Progress:  Did not attend   Therapeutic Modalities Cognitive Behavioral Therapy Motivational Interviewing  Aram Beecham, Theresia Majors 01/25/2022  1:52 PM

## 2022-01-25 NOTE — [facility] Group Notes (Signed)
[facility] Group Notes:  (Nursing/MHT/Case Management/Adjunct)  Date:  01/25/2022  Time:  12:03 PM  Type of Therapy:  Psychoeducational Skills   Participation Level:  Active   Participation Quality:  Appropriate   Affect:  Appropriate   Cognitive:  Appropriate   Insight:  Appropriate   Engagement in Group:  Engaged   Modes of Intervention:  Discussion   Summary of Progress/Problems:   Patient attended Lori participated in a psycho-educational group involving self care. Patient's were asked to three questions involving self care.    What is a self care routine that you are already doing? What is a self care routine that you have recently picked up? What is a self care routine that you would like to start doing?     Skin care Coloring  [facility]  Daneil Lori 01/25/2022, 12:03 PM

## 2022-01-25 NOTE — Telephone Encounter (Signed)
Fyi   Also pt has hosp.f/u on 6/27

## 2022-01-25 NOTE — Telephone Encounter (Signed)
Noted pt has appt already

## 2022-01-25 NOTE — [facility] Group Notes (Signed)
[facility] Group Notes:  (Nursing/MHT/Case Management/Adjunct)  Date:  01/25/2022  Time:  10:51 AM  Type of Therapy:  Group Therapy   Participation Level:  Active   Participation Quality:  Appropriate   Affect:  Appropriate   Cognitive:  Appropriate   Insight:  Appropriate   Engagement in Group:  Engaged   Modes of Intervention:  Discussion   Summary of Progress/Problems:   Patient attended and participated in an orientation and goals group today. Patient's goal is to have a good discharge and to practice self care.   Lori Ponce 01/25/2022, 10:51 AM

## 2022-01-25 NOTE — Telephone Encounter (Signed)
Put her in with me soon after hosp dc    yes I can f/u and rx her [facility] meds  she needs to keep Aug appt with psyciatry

## 2022-01-29 ENCOUNTER — Other Ambulatory Visit: Payer: Self-pay

## 2022-01-29 ENCOUNTER — Telehealth: Payer: No Typology Code available for payment source | Admitting: Physician Assistant

## 2022-01-29 DIAGNOSIS — T50905A Adverse effect of unspecified drugs, medicaments and biological substances, initial encounter: Secondary | ICD-10-CM | POA: Diagnosis not present

## 2022-01-29 DIAGNOSIS — F411 Generalized anxiety disorder: Secondary | ICD-10-CM

## 2022-01-29 MED ORDER — METOPROLOL SUCCINATE ER 25 MG PO TB24
25.0000 mg | ORAL_TABLET | Freq: Every day | ORAL | 0 refills | Status: DC
  Filled 2022-01-29: qty 30, 30d supply, fill #0

## 2022-01-29 MED ORDER — HYDROXYZINE PAMOATE 25 MG PO CAPS
25.0000 mg | ORAL_CAPSULE | Freq: Three times a day (TID) | ORAL | 0 refills | Status: DC | PRN
  Filled 2022-01-29: qty 30, 10d supply, fill #0

## 2022-01-29 MED ORDER — TRIAMCINOLONE ACETONIDE 0.1 % EX CREA
1.0000 | TOPICAL_CREAM | Freq: Two times a day (BID) | CUTANEOUS | 0 refills | Status: DC
  Filled 2022-01-29: qty 30, 15d supply, fill #0

## 2022-01-29 NOTE — Patient Instructions (Signed)
  Lori Ponce, thank you for joining Piedad Climes, PA-C for today's virtual visit.  While this provider is not your primary care provider (PCP), if your PCP is located in our provider database this encounter information will be shared with them immediately following your visit.  Consent: (Patient) Lori Ponce provided verbal consent for this virtual visit at the beginning of the encounter.  Current Medications:  Current Outpatient Medications:    cetirizine (ZYRTEC) 10 MG tablet, Take 10 mg by mouth daily., Disp: , Rfl:    fluticasone (FLONASE) 50 MCG/ACT nasal spray, Place 2 sprays into both nostrils daily. (Patient taking differently: Place 2 sprays into both nostrils daily as needed for allergies.), Disp: 16 g, Rfl: 6   nicotine polacrilex (NICORETTE) 2 MG gum, Take 1 gum (2 mg total) by mouth daily as needed for smoking cessation., Disp: 100 tablet, Rfl: 0   propranolol (INDERAL) 10 MG tablet, Take 1 tablet (10 mg total) by mouth 2 (two) times daily., Disp: 60 tablet, Rfl: 0   traZODone (DESYREL) 50 MG tablet, Take 1 tablet (50 mg total) by mouth once nightly at bedtime as needed for sleep., Disp: 30 tablet, Rfl: 0   Medications ordered in this encounter:  No orders of the defined types were placed in this encounter.    *If you need refills on other medications prior to your next appointment, please contact your pharmacy*  Follow-Up: Call back or seek an in-person evaluation if the symptoms worsen or if the condition fails to improve as anticipated.  Other Instructions Stop the Propranolol. Continue your other regular medications as directed by your discharge summary.   You can apply the topical triamcinolone to the rash of chest to help with itch and inflammation but as the propranolol gets out of your system this should resolve. I am going to try you on Metoprolol ER until you see Psychiatry Friday to try to help further with heart rate and anxiety. If you note any  new or worsening rash while on this stop it and follow-up with your PCP or specialist ASAP.   The hydroxyzine can help with anxiety and the itch from the rash until it resolves.   Keep all follow-up appointments.  I hope you feel better soon!   If you have been instructed to have an in-person evaluation today at a local Urgent Care facility, please use the link below. It will take you to a list of all of our available [facility], including address, phone number and hours of operation. Please do not delay care.  [facility]  If you or a family member do not have a primary care provider, use the link below to schedule a visit and establish care. When you choose a [facility] primary care physician or advanced practice provider, you gain a long-term partner in health. Find a Primary Care Provider  Learn more about [facility]'s in-office and virtual care options: [facility] - Get Care Now

## 2022-01-29 NOTE — Progress Notes (Signed)
Virtual Visit Consent   Lori Ponce, you are scheduled for a virtual visit with a [facility] provider today. Just as with appointments in the office, your consent must be obtained to participate. Your consent will be active for this visit and any virtual visit you may have with one of our providers in the next 365 days. If you have a MyChart account, a copy of this consent can be sent to you electronically.  As this is a virtual visit, video technology does not allow for your provider to perform a traditional examination. This may limit your provider's ability to fully assess your condition. If your provider identifies any concerns that need to be evaluated in person or the need to arrange testing (such as labs, EKG, etc.), we will make arrangements to do so. Although advances in technology are sophisticated, we cannot ensure that it will always work on either your end or our end. If the connection with a video visit is poor, the visit may have to be switched to a telephone visit. With either a video or telephone visit, we are not always able to ensure that we have a secure connection.  By engaging in this virtual visit, you consent to the provision of healthcare and authorize for your insurance to be billed (if applicable) for the services provided during this visit. Depending on your insurance coverage, you may receive a charge related to this service.  I need to obtain your verbal consent now. Are you willing to proceed with your visit today? [name] has provided verbal consent on 01/29/2022 for a virtual visit (video or telephone). Lori Ponce, [place]  Date: 01/29/2022 7:24 AM  Virtual Visit via Video Note   I, Lori Ponce, connected with  [name]  ([id number], 1996-08-07) on 01/29/22 at  7:45 AM EDT by a video-enabled telemedicine application and verified that I am speaking with the correct person using two identifiers.  Location: Patient: Virtual Visit  Location Patient: Home Provider: Virtual Visit Location Provider: Home Office   I discussed the limitations of evaluation and management by telemedicine and the availability of in person appointments. The patient expressed understanding and agreed to proceed.    History of Present Illness: Lori Ponce is a 26 y.o. who identifies as a female who was assigned female at birth, and is being seen today for skin reaction to medication she recently started for elevated heart rate. Patient was admitted to the hospital on 6/14 after presenting to the ER on 6/13 with suicidal ideation. Medical clearance labs were unremarkable and as such she was medically cleared to be admitted to [facility]. Did well during admission per notes. Was started on propranolol 10 mg BID to help with anxiety levels and episodes of racing heart. No EKG performed during ER visit or admission that I can locate. Notes taking medication as directed without any noted improvement yet in anxiety. Notes shortly after starting medication, she has broken out in a rash of her anterior chest that is irritating and itchy. Denies change to soaps, lotions or detergents. Denies any shortness of breath, facial swelling, LH or dizziness. Notes she stopped medication but noticing increased heart rate and anxiety. When very anxious heart rate can get up to 130-150 bpm. Has follow-up with her new OP Psychiatrist Friday and her PCP on 6/27 (per patient report). Cannot see appts in Epic.    HPI: HPI  Problems:  Patient Active Problem List   Diagnosis Date Noted   Suicidal ideation 01/22/2022  Abdominal pain 01/22/2022   Major depressive disorder, recurrent episode with anxious distress (HCC) 06/07/2020   Recurrent moderate major depressive disorder with anxiety (HCC) 05/15/2020   PTSD (post-traumatic stress disorder) 05/15/2020   At risk for intentional self-harm 05/04/2020   Migraine headache 10/13/2012    Allergies: No Known Allergies Medications:   Current Outpatient Medications:    cetirizine (ZYRTEC) 10 MG tablet, Take 10 mg by mouth daily., Disp: , Rfl:    fluticasone (FLONASE) 50 MCG/ACT nasal spray, Place 2 sprays into both nostrils daily. (Patient taking differently: Place 2 sprays into both nostrils daily as needed for allergies.), Disp: 16 g, Rfl: 6   nicotine polacrilex (NICORETTE) 2 MG gum, Take 1 gum (2 mg total) by mouth daily as needed for smoking cessation., Disp: 100 tablet, Rfl: 0   propranolol (INDERAL) 10 MG tablet, Take 1 tablet (10 mg total) by mouth 2 (two) times daily., Disp: 60 tablet, Rfl: 0   traZODone (DESYREL) 50 MG tablet, Take 1 tablet (50 mg total) by mouth once nightly at bedtime as needed for sleep., Disp: 30 tablet, Rfl: 0  Observations/Objective: Patient is well-developed, well-nourished in no acute distress.  Resting comfortably at home.  Head is normocephalic, atraumatic.  No labored breathing. Speech is clear and coherent with logical content.  Patient is alert and oriented at baseline.   Assessment and Plan: 1. Anxiety state  In patient with history of significant anxiety, MDD and history of suicidal ideation. Unfortunately having a reaction to the propranolol which has been stopped now. Hydroxyzine PRN until she sees her specialist. Will try her on Metoprolol ER giving lower side effect profile and more cardioselective on controlling her heart rate while minimizing extracardiac effects.    2. Adverse effect of drug, initial encounter  Stop propranolol. Rash should dissipate as this is removed. Will give her topical Kenalog to help. Can use hydroxyzine to help with itch as will also help with anxiety.   Strict ER precautions reviewed with patient.   Follow Up Instructions: I discussed the assessment and treatment plan with the patient. The patient was provided an opportunity to ask questions and all were answered. The patient agreed with the plan and demonstrated an understanding of the  instructions.  A copy of instructions were sent to the patient via MyChart unless otherwise noted below.   The patient was advised to call back or seek an in-person evaluation if the symptoms worsen or if the condition fails to improve as anticipated.  Time:  I spent 10 minutes with the patient via telehealth technology discussing the above problems/concerns.    Lori Climes, PA-C

## 2022-01-30 ENCOUNTER — Telehealth: Payer: Self-pay

## 2022-01-30 NOTE — Telephone Encounter (Signed)
Transition Care Management Follow-up Telephone Call Date of discharge and from where: 01/25/2022, [facility] How have you been since you were released from the hospital? She stated that she is "doing good." Any questions or concerns? Yes- She explained that the propanolol was not helping her heart rate and she developed a rash on th left side of her chest near her collarbone and extending up her neck that has been quite itchy. She had a virtual visit with a [facility] provider yesterday - 01/29/2022.  She has discontinued taking the propanolol and started metoprolol succinate. She said that she felt good after taking the metoprolol.  She stated that the kenalog cream has not helped the itching and she has not picked up the hydroxyzine. I reviewed the instructions for the hydroxyzine with her and she was not aware that it can help the itching.   Items Reviewed: Did the pt receive and understand the discharge instructions provided? Yes  Medications obtained and verified?  Information about medications noted above.  Other? No  Any new allergies since your discharge? No  Dietary orders reviewed? No Do you have support at home?  She said that she has a big support system.   Home Care and Equipment/Supplies: Were home health services ordered? no If so, what is the name of the agency? N/a  Has the agency set up a time to come to the patient's home? not applicable Were any new equipment or medical supplies ordered?  No What is the name of the medical supply agency? N/a Were you able to get the supplies/equipment? not applicable Do you have any questions related to the use of the equipment or supplies? No  Functional Questionnaire: (I = Independent and D = Dependent) ADLs: independent   Follow up appointments reviewed:  PCP Hospital f/u appt confirmed? Yes  Scheduled to see Dr Delford Field -02/05/2022.   Specialist Hospital f/u appt confirmed? Yes  Scheduled to see physiatry - 02/01/2022 and psychiatry -  8/17/023    Are transportation arrangements needed? No  If their condition worsens, is the pt aware to call PCP or go to the Emergency Dept.? Yes Was the patient provided with contact information for the PCP's office or ED? Yes Was to pt encouraged to call back with questions or concerns? Yes

## 2022-02-01 ENCOUNTER — Encounter: Payer: No Typology Code available for payment source | Admitting: Physical Medicine and Rehabilitation

## 2022-02-05 ENCOUNTER — Ambulatory Visit
Payer: No Typology Code available for payment source | Attending: Critical Care Medicine | Admitting: Critical Care Medicine

## 2022-02-05 ENCOUNTER — Encounter: Payer: Self-pay | Admitting: Critical Care Medicine

## 2022-02-05 VITALS — BP 115/79 | HR 75 | Wt 133.2 lb

## 2022-02-05 DIAGNOSIS — Z9189 Other specified personal risk factors, not elsewhere classified: Secondary | ICD-10-CM

## 2022-02-05 DIAGNOSIS — F419 Anxiety disorder, unspecified: Secondary | ICD-10-CM | POA: Diagnosis not present

## 2022-02-05 DIAGNOSIS — R1011 Right upper quadrant pain: Secondary | ICD-10-CM

## 2022-02-05 DIAGNOSIS — R45851 Suicidal ideations: Secondary | ICD-10-CM

## 2022-02-05 DIAGNOSIS — F331 Major depressive disorder, recurrent, moderate: Secondary | ICD-10-CM | POA: Diagnosis not present

## 2022-02-05 NOTE — Assessment & Plan Note (Signed)
Likely somatic complaint this is resolved

## 2022-02-05 NOTE — Assessment & Plan Note (Signed)
Suicidal ideation has resolved  Follow-up with mental health

## 2022-02-05 NOTE — Assessment & Plan Note (Signed)
No longer at risk for self-harm we will monitor

## 2022-02-05 NOTE — Assessment & Plan Note (Signed)
Tolerating metoprolol well no changes made follow-up with mental health

## 2022-02-07 ENCOUNTER — Ambulatory Visit (HOSPITAL_BASED_OUTPATIENT_CLINIC_OR_DEPARTMENT_OTHER): Payer: No Typology Code available for payment source | Admitting: Psychiatry

## 2022-02-07 ENCOUNTER — Encounter (HOSPITAL_COMMUNITY): Payer: Self-pay | Admitting: Psychiatry

## 2022-02-07 ENCOUNTER — Other Ambulatory Visit: Payer: Self-pay

## 2022-02-07 VITALS — Wt 133.0 lb

## 2022-02-07 DIAGNOSIS — F339 Major depressive disorder, recurrent, unspecified: Secondary | ICD-10-CM

## 2022-02-07 DIAGNOSIS — F1021 Alcohol dependence, in remission: Secondary | ICD-10-CM | POA: Diagnosis not present

## 2022-02-07 DIAGNOSIS — F4310 Post-traumatic stress disorder, unspecified: Secondary | ICD-10-CM

## 2022-02-07 MED ORDER — FLUOXETINE HCL 10 MG PO CAPS
10.0000 mg | ORAL_CAPSULE | Freq: Every day | ORAL | 1 refills | Status: DC
  Filled 2022-02-07 – 2022-02-08 (×2): qty 30, 30d supply, fill #0
  Filled 2022-03-12: qty 30, 30d supply, fill #1
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
## 2022-02-08 ENCOUNTER — Other Ambulatory Visit: Payer: Self-pay

## 2022-02-28 ENCOUNTER — Inpatient Hospital Stay: Payer: No Typology Code available for payment source | Admitting: Physician Assistant

## 2022-03-12 ENCOUNTER — Other Ambulatory Visit: Payer: Self-pay | Admitting: Critical Care Medicine

## 2022-03-12 ENCOUNTER — Other Ambulatory Visit: Payer: Self-pay

## 2022-03-12 MED ORDER — CETIRIZINE HCL 10 MG PO TABS
10.0000 mg | ORAL_TABLET | Freq: Every day | ORAL | 6 refills | Status: AC
  Filled 2022-03-12 – 2022-03-18 (×2): qty 30, 30d supply, fill #0

## 2022-03-14 ENCOUNTER — Telehealth (HOSPITAL_BASED_OUTPATIENT_CLINIC_OR_DEPARTMENT_OTHER): Payer: No Typology Code available for payment source | Admitting: Psychiatry

## 2022-03-14 ENCOUNTER — Other Ambulatory Visit: Payer: Self-pay

## 2022-03-14 ENCOUNTER — Encounter (HOSPITAL_COMMUNITY): Payer: Self-pay | Admitting: Psychiatry

## 2022-03-14 VITALS — Wt 139.0 lb

## 2022-03-14 DIAGNOSIS — F339 Major depressive disorder, recurrent, unspecified: Secondary | ICD-10-CM | POA: Diagnosis not present

## 2022-03-14 DIAGNOSIS — F4310 Post-traumatic stress disorder, unspecified: Secondary | ICD-10-CM | POA: Diagnosis not present

## 2022-03-14 DIAGNOSIS — F1021 Alcohol dependence, in remission: Secondary | ICD-10-CM

## 2022-03-14 MED ORDER — FLUOXETINE HCL 10 MG PO CAPS
10.0000 mg | ORAL_CAPSULE | Freq: Every day | ORAL | 2 refills | Status: DC
  Filled 2022-03-18 – 2022-04-17 (×2): qty 30, 30d supply, fill #0

## 2022-03-14 MED ORDER — HYDROXYZINE PAMOATE 25 MG PO CAPS
25.0000 mg | ORAL_CAPSULE | Freq: Every evening | ORAL | 2 refills | Status: DC | PRN
  Filled 2022-03-14: qty 30, 30d supply, fill #0
  Filled 2022-04-17: qty 30, 30d supply, fill #1
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
## 2022-03-15 ENCOUNTER — Encounter: Payer: No Typology Code available for payment source | Admitting: Physical Medicine and Rehabilitation

## 2022-03-17 ENCOUNTER — Other Ambulatory Visit: Payer: Self-pay | Admitting: Critical Care Medicine

## 2022-03-17 DIAGNOSIS — F411 Generalized anxiety disorder: Secondary | ICD-10-CM

## 2022-03-17 MED ORDER — METOPROLOL SUCCINATE ER 25 MG PO TB24
25.0000 mg | ORAL_TABLET | Freq: Every day | ORAL | 4 refills | Status: AC
  Filled 2022-03-17: qty 30, 30d supply, fill #0
  Filled 2022-04-17: qty 30, 30d supply, fill #1

## 2022-03-18 ENCOUNTER — Other Ambulatory Visit: Payer: Self-pay

## 2022-03-18 ENCOUNTER — Telehealth: Payer: Self-pay

## 2022-03-18 NOTE — Telephone Encounter (Signed)
Called patient to let her know about about medication that was filled.

## 2022-03-28 ENCOUNTER — Ambulatory Visit (HOSPITAL_COMMUNITY): Payer: Self-pay | Admitting: Psychiatry

## 2022-04-11 ENCOUNTER — Other Ambulatory Visit: Payer: Self-pay

## 2022-04-17 ENCOUNTER — Other Ambulatory Visit: Payer: Self-pay

## 2022-04-22 ENCOUNTER — Other Ambulatory Visit: Payer: Self-pay

## 2022-05-14 ENCOUNTER — Other Ambulatory Visit: Payer: Self-pay

## 2022-05-14 ENCOUNTER — Telehealth: Payer: Self-pay | Admitting: Physician Assistant

## 2022-05-14 ENCOUNTER — Telehealth: Payer: Self-pay | Admitting: Family Medicine

## 2022-05-14 DIAGNOSIS — R0981 Nasal congestion: Secondary | ICD-10-CM

## 2022-05-14 DIAGNOSIS — J029 Acute pharyngitis, unspecified: Secondary | ICD-10-CM

## 2022-05-14 DIAGNOSIS — R6889 Other general symptoms and signs: Secondary | ICD-10-CM

## 2022-05-14 MED ORDER — OSELTAMIVIR PHOSPHATE 75 MG PO CAPS
75.0000 mg | ORAL_CAPSULE | Freq: Two times a day (BID) | ORAL | 0 refills | Status: AC
  Filled 2022-05-14: qty 10, 5d supply, fill #0

## 2022-05-14 NOTE — Patient Instructions (Signed)
Lori Ponce, thank you for joining Leeanne Rio, PA-C for today's virtual visit.  While this provider is not your primary care provider (PCP), if your PCP is located in our provider database this encounter information will be shared with them immediately following your visit.  Consent: (Patient) Lori Ponce provided verbal consent for this virtual visit at the beginning of the encounter.  Current Medications:  Current Outpatient Medications:    cetirizine (ZYRTEC) 10 MG tablet, Take 1 tablet (10 mg total) by mouth daily., Disp: 30 tablet, Rfl: 6   FLUoxetine (PROZAC) 10 MG capsule, Take 1 capsule (10 mg total) by mouth daily., Disp: 30 capsule, Rfl: 2   fluticasone (FLONASE) 50 MCG/ACT nasal spray, Place 2 sprays into both nostrils daily. (Patient taking differently: Place 2 sprays into both nostrils daily as needed for allergies.), Disp: 16 g, Rfl: 6   hydrOXYzine (VISTARIL) 25 MG capsule, Take 1 capsule (25 mg total) by mouth at bedtime as needed., Disp: 30 capsule, Rfl: 2   metoprolol succinate (TOPROL-XL) 25 MG 24 hr tablet, Take 1 tablet (25 mg total) by mouth daily., Disp: 30 tablet, Rfl: 4   nicotine polacrilex (NICORETTE) 2 MG gum, Take 1 gum (2 mg total) by mouth daily as needed for smoking cessation. (Patient not taking: Reported on 02/07/2022), Disp: 100 tablet, Rfl: 0   traZODone (DESYREL) 50 MG tablet, Take 1 tablet (50 mg total) by mouth once nightly at bedtime as needed for sleep. (Patient not taking: Reported on 02/07/2022), Disp: 30 tablet, Rfl: 0   triamcinolone cream (KENALOG) 0.1 %, Apply 1 Application topically 2 (two) times daily., Disp: 30 g, Rfl: 0   Medications ordered in this encounter:  No orders of the defined types were placed in this encounter.    *If you need refills on other medications prior to your next appointment, please contact your pharmacy*  Follow-Up: Call back or seek an in-person evaluation if the symptoms worsen or if the condition  fails to improve as anticipated.  [place] [phone]  Other Instructions Please repeat COVID test tomorrow to be cautious. Message [place] with results.  For now we will treat as suspected flu. Increase fluids. Rest. Start a saline nasal rinse. Ok to use Dayquil/Nyquil or Theraflu OTC.  Consider staring Vitamin D3 1000 units daily, Vitamin C 1000 mg daily and a zinc supplement.  Take Tamiflu as directed. Quarantine at home until you have messaged with your repeat test results.   Influenza, Adult Influenza is also called "the flu." It is an infection in the lungs, nose, and throat (respiratory tract). It spreads easily from person to person (is contagious). The flu causes symptoms that are like a cold, along with high fever and body aches. What are the causes? This condition is caused by the influenza virus. You can get the virus by: Breathing in droplets that are in the air after a person infected with the flu coughed or sneezed. Touching something that has the virus on it and then touching your mouth, nose, or eyes. What increases the risk? Certain things may make you more likely to get the flu. These include: Not washing your hands often. Having close contact with many people during cold and flu season. Touching your mouth, eyes, or nose without first washing your hands. Not getting a flu shot every year. You may have a higher risk for the flu, and serious problems, such as a lung infection (pneumonia), if you: Are older than 65. Are pregnant.  Have a weakened disease-fighting system (immune system) because of a disease or because you are taking certain medicines. Have a long-term (chronic) condition, such as: Heart, kidney, or lung disease. Diabetes. Asthma. Have a liver disorder. Are very overweight (morbidly obese). Have anemia. What are the signs or symptoms? Symptoms usually begin suddenly and last 4-14 days. They may include: Fever and chills. Headaches,  body aches, or muscle aches. Sore throat. Cough. Runny or stuffy (congested) nose. Feeling discomfort in your chest. Not wanting to eat as much as normal. Feeling weak or tired. Feeling dizzy. Feeling sick to your stomach or throwing up. How is this treated? If the flu is found early, you can be treated with antiviral medicine. This can help to reduce how bad the illness is and how long it lasts. This may be given by mouth or through an IV tube. Taking care of yourself at home can help your symptoms get better. Your doctor may want you to: Take over-the-counter medicines. Drink plenty of fluids. The flu often goes away on its own. If you have very bad symptoms or other problems, you may be treated in a hospital. Follow these instructions at home:     Activity Rest as needed. Get plenty of sleep. Stay home from work or school as told by your doctor. Do not leave home until you do not have a fever for 24 hours without taking medicine. Leave home only to go to your doctor. Eating and drinking Take an ORS (oral rehydration solution). This is a drink that is sold at pharmacies and stores. Drink enough fluid to keep your pee pale yellow. Drink clear fluids in small amounts as you are able. Clear fluids include: Water. Ice chips. Fruit juice mixed with water. Low-calorie sports drinks. Eat bland foods that are easy to digest. Eat small amounts as you are able. These foods include: Bananas. Applesauce. Rice. Lean meats. Toast. Crackers. Do not eat or drink: Fluids that have a lot of sugar or caffeine. Alcohol. Spicy or fatty foods. General instructions Take over-the-counter and prescription medicines only as told by your doctor. Use a cool mist humidifier to add moisture to the air in your home. This can make it easier for you to breathe. When using a cool mist humidifier, clean it daily. Empty water and replace with clean water. Cover your mouth and nose when you cough or  sneeze. Wash your hands with soap and water often and for at least 20 seconds. This is also important after you cough or sneeze. If you cannot use soap and water, use alcohol-based hand sanitizer. Keep all follow-up visits. How is this prevented?  Get a flu shot every year. You may get the flu shot in late summer, fall, or winter. Ask your doctor when you should get your flu shot. Avoid contact with people who are sick during fall and winter. This is cold and flu season. Contact a doctor if: You get new symptoms. You have: Chest pain. Watery poop (diarrhea). A fever. Your cough gets worse. You start to have more mucus. You feel sick to your stomach. You throw up. Get help right away if you: Have shortness of breath. Have trouble breathing. Have skin or nails that turn a bluish color. Have very bad pain or stiffness in your neck. Get a sudden headache. Get sudden pain in your face or ear. Cannot eat or drink without throwing up. These symptoms may represent a serious problem that is an emergency. Get medical help right away.  Call your local emergency services (911 in the [place] Do not wait to see if the symptoms will go away. Do not drive yourself to the hospital. Summary Influenza is also called "the flu." It is an infection in the lungs, nose, and throat. It spreads easily from person to person. Take over-the-counter and prescription medicines only as told by your doctor. Getting a flu shot every year is the best way to not get the flu. This information is not intended to replace advice given to you by your health care provider. Make sure you discuss any questions you have with your health care provider. Document Revised: 03/17/2020 Document Reviewed: 03/17/2020 Elsevier Patient Education  2023 Elsevier Inc.   If you have been instructed to have an in-person evaluation today at a local Urgent Care facility, please use the link below. It will take you to a list of all of our  available [facility], including address, phone number and hours of operation. Please do not delay care.  [facility]  If you or a family member do not have a primary care provider, use the link below to schedule a visit and establish care. When you choose a [facility] primary care physician or advanced practice provider, you gain a long-term partner in health. Find a Primary Care Provider  Learn more about [facility] in-office and virtual care options: [facility] - Get Care Now

## 2022-05-14 NOTE — Progress Notes (Signed)
Virtual Visit Consent   Lori Ponce, you are scheduled for a virtual visit with a [facility] provider today. Just as with appointments in the office, your consent must be obtained to participate. Your consent will be active for this visit and any virtual visit you may have with one of our providers in the next 365 days. If you have a MyChart account, a copy of this consent can be sent to you electronically.  As this is a virtual visit, video technology does not allow for your provider to perform a traditional examination. This may limit your provider's ability to fully assess your condition. If your provider identifies any concerns that need to be evaluated in person or the need to arrange testing (such as labs, EKG, etc.), we will make arrangements to do so. Although advances in technology are sophisticated, we cannot ensure that it will always work on either your end or our end. If the connection with a video visit is poor, the visit may have to be switched to a telephone visit. With either a video or telephone visit, we are not always able to ensure that we have a secure connection.  By engaging in this virtual visit, you consent to the provision of healthcare and authorize for your insurance to be billed (if applicable) for the services provided during this visit. Depending on your insurance coverage, you may receive a charge related to this service.  I need to obtain your verbal consent now. Are you willing to proceed with your visit today? Lori Ponce has provided verbal consent on 05/14/2022 for a virtual visit (video or telephone). Lori Ponce, [place]  Date: 05/14/2022 4:53 PM  Virtual Visit via Video Note   I, Lori Ponce, connected with  Lori Ponce  ([id number], 1996/01/27) on 05/14/22 at  4:45 PM EDT by a video-enabled telemedicine application and verified that I am speaking with the correct person using two identifiers.  Location: Patient: Virtual Visit  Location Patient: Lori Ponce: Virtual Visit Location Provider: Home Office   I discussed the limitations of evaluation and management by telemedicine and the availability of in person appointments. The patient expressed understanding and agreed to proceed.    History of Present Illness: Lori Ponce is a 26 y.o. who identifies as a nonbinary who was assigned adult at birth, and is being seen today for abrupt onset of nasal congestion, cough, sore throat, headache and body aches starting early this morning around 3 AM an d continuing since onset. Denies fever, chest pain or SOB. Denies recent travel or sick contact. Took home COVID test which was negative.     HPI: HPI  Problems:  Patient Active Problem List   Diagnosis Date Noted   Suicidal ideation 01/22/2022   Major depressive disorder, recurrent episode with anxious distress (HCC) 06/07/2020   Recurrent moderate major depressive disorder with anxiety (HCC) 05/15/2020   PTSD (post-traumatic stress disorder) 05/15/2020   Migraine headache 10/13/2012    Allergies: No Known Allergies Medications:  Current Outpatient Medications:    oseltamivir (TAMIFLU) 75 MG capsule, Take 1 capsule (75 mg total) by mouth 2 (two) times daily., Disp: 10 capsule, Rfl: 0   cetirizine (ZYRTEC) 10 MG tablet, Take 1 tablet (10 mg total) by mouth daily., Disp: 30 tablet, Rfl: 6   FLUoxetine (PROZAC) 10 MG capsule, Take 1 capsule (10 mg total) by mouth daily., Disp: 30 capsule, Rfl: 2   fluticasone (FLONASE) 50 MCG/ACT nasal spray, Place 2 sprays into both nostrils daily. (  Patient taking differently: Place 2 sprays into both nostrils daily as needed for allergies.), Disp: 16 g, Rfl: 6   hydrOXYzine (VISTARIL) 25 MG capsule, Take 1 capsule (25 mg total) by mouth at bedtime as needed., Disp: 30 capsule, Rfl: 2   metoprolol succinate (TOPROL-XL) 25 MG 24 hr tablet, Take 1 tablet (25 mg total) by mouth daily., Disp: 30 tablet, Rfl:  4  Observations/Objective: Patient is well-developed, well-nourished in no acute distress.  Resting comfortably in Lori car. Head is normocephalic, atraumatic.  No labored breathing. Speech is clear and coherent with logical content.  Patient is alert and oriented at baseline.    Assessment and Plan: 1. Flu-like symptoms - oseltamivir (TAMIFLU) 75 MG capsule; Take 1 capsule (75 mg total) by mouth 2 (two) times daily.  Dispense: 10 capsule; Refill: 0  COVID test negative. Will have her repeat in 24 hours to be cautious. Quarantine reviewed for now pending repeat test. Increase fluids. Rest. Supportive measures, OTC medications and Vitamin recommendations reviewed. 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  Follow Up Instructions: I discussed the assessment and treatment plan with the patient. The patient was provided an opportunity to ask questions and all were answered. The patient agreed with the plan and demonstrated an understanding of the instructions.  A copy of instructions were sent to the patient via MyChart unless otherwise noted below.    The patient was advised to call back or seek an in-person evaluation if the symptoms worsen or if the condition fails to improve as anticipated.  Time:  I spent 10 minutes with the patient via telehealth technology discussing the above problems/concerns.    Leeanne Rio, PA-C

## 2022-05-14 NOTE — Progress Notes (Signed)
Lori Ponce   Needs to go get COVID test and will make new appt time  Patient acknowledged agreement and understanding of the plan.

## 2022-05-15 ENCOUNTER — Other Ambulatory Visit: Payer: Self-pay

## 2022-06-04 ENCOUNTER — Ambulatory Visit: Payer: No Typology Code available for payment source | Admitting: Critical Care Medicine

## 2022-06-05 ENCOUNTER — Ambulatory Visit: Payer: Medicaid Other | Attending: Critical Care Medicine | Admitting: Physician Assistant

## 2022-06-05 ENCOUNTER — Encounter: Payer: Self-pay | Admitting: Physician Assistant

## 2022-06-05 VITALS — BP 107/73 | HR 71 | Ht 62.0 in | Wt 137.8 lb

## 2022-06-05 DIAGNOSIS — F411 Generalized anxiety disorder: Secondary | ICD-10-CM

## 2022-06-05 DIAGNOSIS — Z23 Encounter for immunization: Secondary | ICD-10-CM

## 2022-06-05 NOTE — Progress Notes (Signed)
Patient ID: Lori Ponce, adult   DOB: 1996-05-06, 26 y.o.   MRN: [id number]   Lori Ponce, is a 26 y.o. adult  [id number]:[id number]  [id number]  DOB - 03-Nov-1995  Chief Complaint  Patient presents with   Medication Refill       Subjective:   Lori Ponce is a 26 y.o. adult here today for followup.  She has been doing great in 12 step recovery but did have a brief relapse.  She has been sober 25 days again.    Prozac is working really well for her.  Takes hydroxyzine occasionally.  She is still taking metoprolol and has several RF on it.   Occasional passive SI but no plan or intent anymore.  She wants to have "baseline labs" but had an extensive panel in June       06/05/2022    3:46 PM 02/05/2022    3:40 PM 01/22/2022    2:30 PM 07/19/2021    8:59 AM 04/09/2021    2:25 PM  Depression screen PHQ 2/9  Decreased Interest 0 1 2 0 0  Down, Depressed, Hopeless 0 1 1 0 0  PHQ - 2 Score 0 2 3 0 0  Altered sleeping  0 1  0  Tired, decreased energy  0 1  0  Change in appetite  0 3  3  Feeling bad or failure about yourself   1 2  3   Trouble concentrating  0 1  1  Moving slowly or fidgety/restless  0 0  0  Suicidal thoughts  1 2  0  PHQ-9 Score  4 13  7   Difficult doing work/chores     Not difficult at all      06/05/2022    3:46 PM 02/05/2022    3:42 PM 01/22/2022    2:31 PM 07/19/2021    8:59 AM  GAD 7 : Generalized Anxiety Score  Nervous, Anxious, on Edge 0 2 2 2   Control/stop worrying 0 0 1 0  Worry too much - different things 0 2 1 0  Trouble relaxing 0 0 1 0  Restless 0 0 2 0  Easily annoyed or irritable 2 1  2   Afraid - awful might happen 0 1 3 0  Total GAD 7 Score 2 6  4       No problems updated.  ALLERGIES: No Known Allergies  PAST MEDICAL HISTORY: Past Medical History:  Diagnosis Date   At risk for intentional self-harm 05/04/2020   Influenza A 07/19/2021    MEDICATIONS AT HOME: Prior to Admission medications   Medication Sig Start Date  End Date Taking? Authorizing Provider  cetirizine (ZYRTEC) 10 MG tablet Take 1 tablet (10 mg total) by mouth daily. 03/12/22  Yes Elsie Stain, MD  FLUoxetine (PROZAC) 10 MG capsule Take 1 capsule (10 mg total) by mouth daily. 03/14/22 03/14/23 Yes Arfeen, Arlyce Harman, MD  fluticasone (FLONASE) 50 MCG/ACT nasal spray Place 2 sprays into both nostrils daily. Patient taking differently: Place 2 sprays into both nostrils daily as needed for allergies. 09/19/21  Yes Mayers, Cari S, PA-C  hydrOXYzine (VISTARIL) 25 MG capsule Take 1 capsule (25 mg total) by mouth at bedtime as needed. 03/14/22  Yes Arfeen, Arlyce Harman, MD  metoprolol succinate (TOPROL-XL) 25 MG 24 hr tablet Take 1 tablet (25 mg total) by mouth daily. 03/17/22  Yes Elsie Stain, MD  oseltamivir (TAMIFLU) 75 MG capsule Take 1 capsule (75 mg total) by mouth 2 (two) times daily.  05/14/22   Waldon Merl, PA-C  escitalopram (LEXAPRO) 20 MG tablet Take 1 tablet (20 mg total) by mouth daily. 07/17/20 09/28/20  Marcine Matar, MD    ROS: Neg HEENT Neg resp Neg cardiac Neg GI Neg GU Neg MS Neg psych Neg neuro  Objective:   Vitals:   06/05/22 1544  BP: 107/73  Pulse: 71  SpO2: 97%  Weight: 137 lb 12.8 oz (62.5 kg)  Height: 5\' 2"  (1.575 m)   Exam General appearance : Awake, alert, not in any distress. Speech Clear. Not toxic looking HEENT: Atraumatic and Normocephalic Neck: Supple, no JVD. No cervical lymphadenopathy.  Chest: Good air entry bilaterally, CTAB.  No rales/rhonchi/wheezing CVS: S1 S2 regular, no murmurs.  Extremities: B/L Lower Ext shows no edema, both legs are warm to touch Neurology: Awake alert, and oriented X 3, CN II-XII intact, Non focal Skin: No Rash  Data Review Lab Results  Component Value Date   HGBA1C 4.8 01/24/2022    Assessment & Plan   1. Anxiety state Stable on meds-doing great-sees  Dr 01/26/2022 next week.  GAD and PHQ9 much improved.  Passive SI without plan or intent  2. 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  The patient was given clear instructions to go to ER or return to medical center if symptoms don't improve, worsen or new problems develop. The patient verbalized understanding. The patient was told to call to get lab results if they haven't heard anything in the next week.      12/07/2022, PA-C [facility] [place], [place]   06/05/2022, 3:54 PM

## 2022-06-05 NOTE — Patient Instructions (Signed)
[email or web address] is the website for narcotics anonymous [employer] (website) or [phone] is the information for alcoholics anonymous Both are free and immediately available for help with alcohol and drug use  

## 2022-06-13 ENCOUNTER — Encounter (HOSPITAL_COMMUNITY): Payer: Self-pay | Admitting: Psychiatry

## 2022-06-13 ENCOUNTER — Telehealth (HOSPITAL_BASED_OUTPATIENT_CLINIC_OR_DEPARTMENT_OTHER): Payer: Self-pay | Admitting: Psychiatry

## 2022-06-13 ENCOUNTER — Other Ambulatory Visit: Payer: Self-pay

## 2022-06-13 DIAGNOSIS — F1021 Alcohol dependence, in remission: Secondary | ICD-10-CM

## 2022-06-13 DIAGNOSIS — F4310 Post-traumatic stress disorder, unspecified: Secondary | ICD-10-CM

## 2022-06-13 DIAGNOSIS — F339 Major depressive disorder, recurrent, unspecified: Secondary | ICD-10-CM

## 2022-06-13 MED ORDER — FLUOXETINE HCL 10 MG PO CAPS
10.0000 mg | ORAL_CAPSULE | Freq: Every day | ORAL | 2 refills | Status: AC
  Filled 2022-06-13: qty 30, 30d supply, fill #0

## 2022-06-13 MED ORDER — HYDROXYZINE PAMOATE 25 MG PO CAPS
25.0000 mg | ORAL_CAPSULE | Freq: Every evening | ORAL | 2 refills | Status: AC | PRN
  Filled 2022-06-13: qty 30, 30d supply, fill #0
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
## 2022-06-19 ENCOUNTER — Other Ambulatory Visit: Payer: Self-pay

## 2022-09-12 ENCOUNTER — Telehealth (HOSPITAL_COMMUNITY): Payer: Self-pay | Admitting: Psychiatry

## 2022-12-18 ENCOUNTER — Ambulatory Visit: Payer: Medicaid Other | Attending: Critical Care Medicine | Admitting: Critical Care Medicine
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
## 2023-05-15 IMAGING — DX DG FOOT COMPLETE 3+V*L*
3 series · 3 of 3 positions shown · non-contrast
Comparison: None.

CLINICAL DATA: Concern for retained glass foreign body

EXAM:
LEFT FOOT - COMPLETE 3+ VIEW

[foot supine dp]
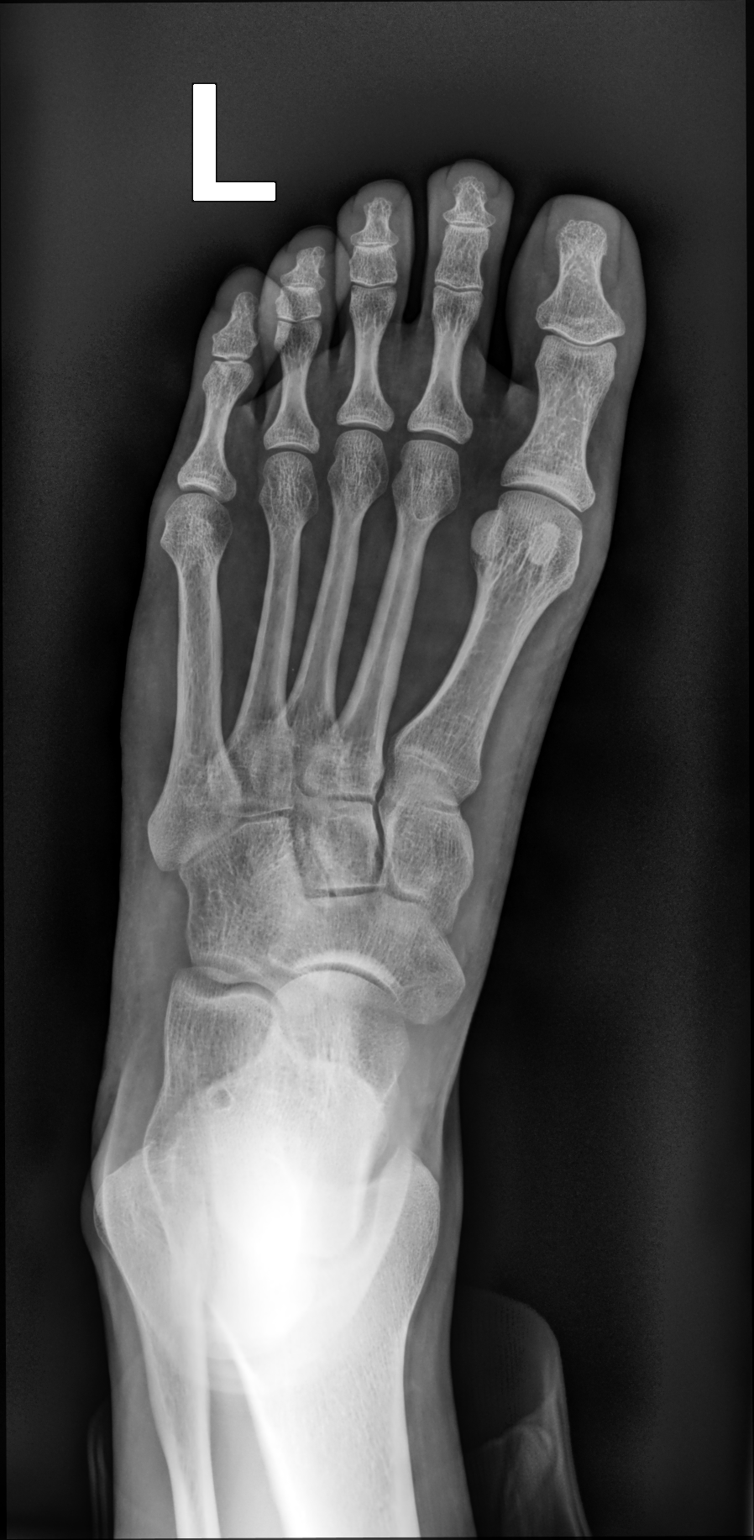

[foot medial oblique]
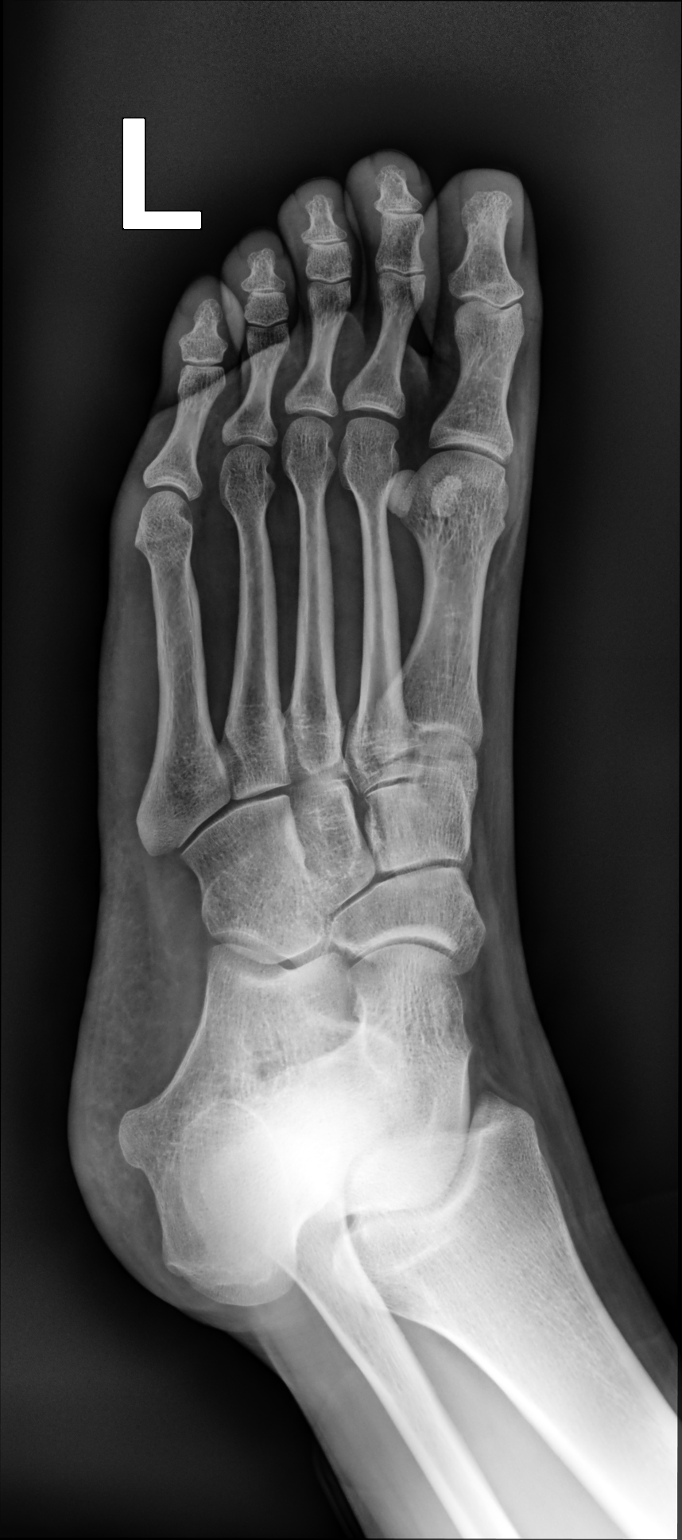

[foot supine lat]
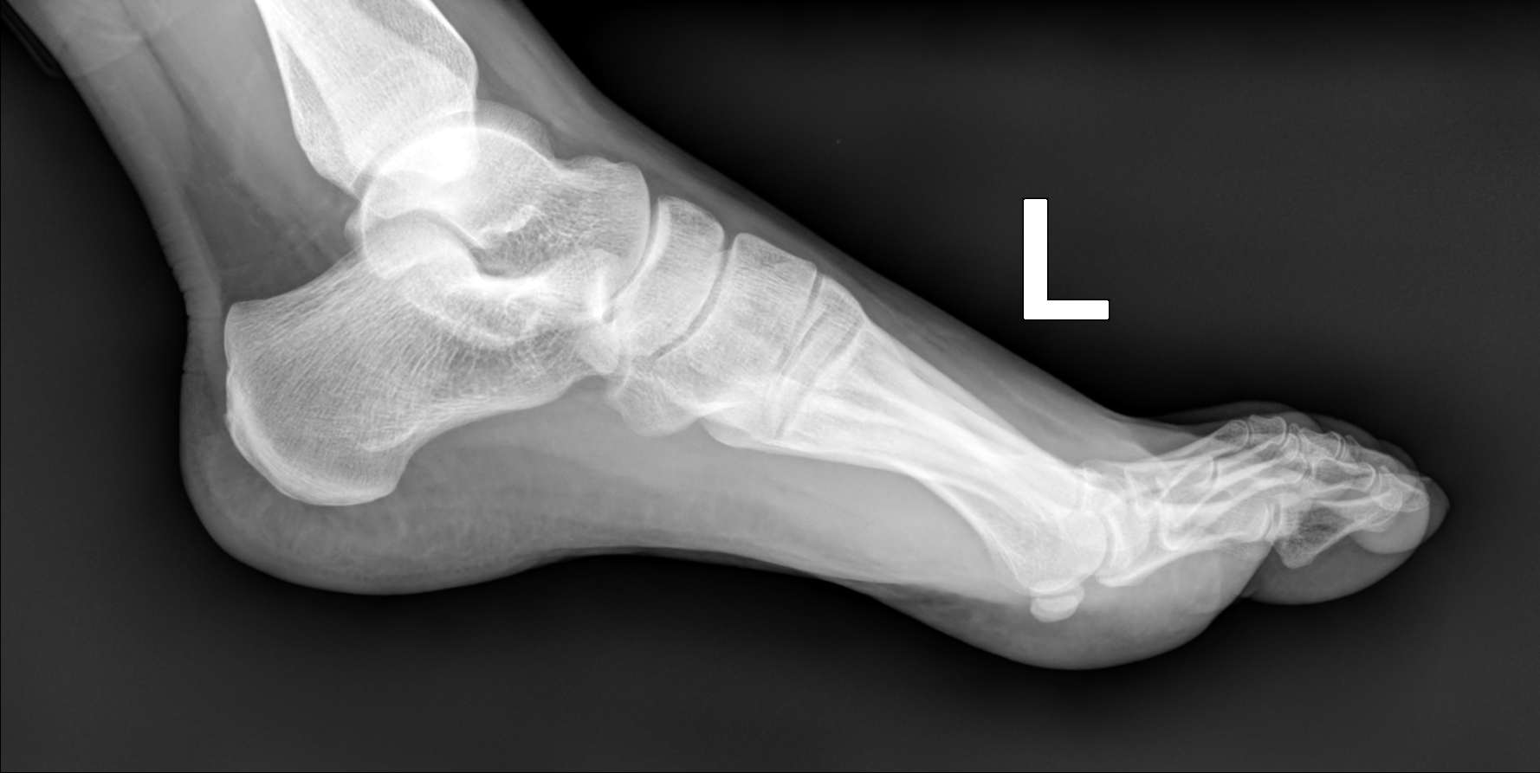

[3 of 3 positions shown; findings below may reference images not displayed]

FINDINGS: There is no evidence of fracture or dislocation. There is no
evidence of arthropathy or other focal bone abnormality. Soft
tissues are unremarkable. No radiopaque foreign body is seen within
the soft tissues.
IMPRESSION: Negative. No radiopaque foreign body. If persistent clinical concern
for retained foreign body, ultrasound could be considered.

## 2024-02-09 ENCOUNTER — Ambulatory Visit
Admission: RE | Admit: 2024-02-09 | Discharge: 2024-02-09 | Disposition: A | Discharge status: Home / Self Care | Source: Ambulatory Visit | Attending: Family Medicine | Admitting: Family Medicine

## 2024-02-09 ENCOUNTER — Ambulatory Visit: Admitting: Radiology

## 2024-02-09 VITALS — BP 102/63 | HR 72 | Temp 98.2°F | Resp 17

## 2024-02-09 DIAGNOSIS — S99911A Unspecified injury of right ankle, initial encounter: Secondary | ICD-10-CM | POA: Diagnosis not present

## 2024-02-09 DIAGNOSIS — S9001XA Contusion of right ankle, initial encounter: Secondary | ICD-10-CM

## 2024-02-09 NOTE — ED Triage Notes (Signed)
 Pt presents with injury to right ankle that happened Thursday at work.   States they were unload truck and 50lb box fell on foot.

## 2024-02-09 NOTE — Discharge Instructions (Addendum)
 Your x-ray was negative , no fracture, no dislocation Wear ace wrap,use crutches,Tip toe weight bearing right ankle until cleared by Orthopedics May take tylenol ,ibuprofen  as label directed for pain Ice and elevate when able 15-20 min 3 x daily Follow up with your work regarding workman's comp/orthopedic follow up-call for appt

## 2024-02-09 NOTE — ED Provider Notes (Signed)
 [facility]    CSN: [id number] Arrival date & time: 02/09/24  1833      History   Chief Complaint Chief Complaint  Patient presents with   Ankle Pain    Entered by patient    HPI Lori Ponce is a 28 y.o. adult.   28 year old female, Lori Ponce, presents to urgent care for evaluation of right ankle injury that occurred 02/05/24 at work.  Patient states that she was unloading a truck and a 50 pound box fell onto right ankle. Pt states she has been waiting on my job to send me to get it checked out and they haven't so I'm here.  Patient reports pain with weightbearing.  Patient is using ice and over-the-counter meds for symptom management.  Patient states she has rested, iced, elevated, right ankle since incident  The history is provided by the patient. No language interpreter was used.    Past Medical History:  Diagnosis Date   At risk for intentional self-harm 05/04/2020   Influenza A 07/19/2021    Patient Active Problem List   Diagnosis Date Noted   Injury of right ankle 02/09/2024   Suicidal ideation 01/22/2022   Major depressive disorder, recurrent episode with anxious distress (HCC) 06/07/2020   Recurrent moderate major depressive disorder with anxiety (HCC) 05/15/2020   PTSD (post-traumatic stress disorder) 05/15/2020   Migraine headache 10/13/2012    Past Surgical History:  Procedure Laterality Date   NO PAST SURGERIES      OB History   No obstetric history on file.      Home Medications    Prior to Admission medications   Medication Sig Start Date End Date Taking? Authorizing Provider  cetirizine  (ZYRTEC ) 10 MG tablet Take 1 tablet (10 mg total) by mouth daily. 03/12/22   Brien Belvie BRAVO, MD  FLUoxetine  (PROZAC ) 10 MG capsule Take 1 capsule (10 mg total) by mouth daily. 06/13/22 06/13/23  Arfeen, Leni DASEN, MD  fluticasone  (FLONASE ) 50 MCG/ACT nasal spray Place 2 sprays into both nostrils daily. Patient taking differently: Place 2  sprays into both nostrils daily as needed for allergies. 09/19/21   Mayers, Cari S, PA-C  hydrOXYzine  (VISTARIL ) 25 MG capsule Take 1 capsule (25 mg total) by mouth at bedtime as needed. 06/13/22   Arfeen, Leni DASEN, MD  metoprolol  succinate (TOPROL -XL) 25 MG 24 hr tablet Take 1 tablet (25 mg total) by mouth daily. 03/17/22   Brien Belvie BRAVO, MD  oseltamivir  (TAMIFLU ) 75 MG capsule Take 1 capsule (75 mg total) by mouth 2 (two) times daily. 05/14/22   Gladis Elsie BROCKS, PA-C  escitalopram  (LEXAPRO ) 20 MG tablet Take 1 tablet (20 mg total) by mouth daily. 07/17/20 09/28/20  Vicci Barnie NOVAK, MD    Family History Family History  Problem Relation Age of Onset   Diabetes Maternal Grandmother    Diabetes Maternal Grandfather     Social History Social History   Tobacco Use   Smoking status: Never   Smokeless tobacco: Current  Vaping Use   Vaping status: Never Used  Substance Use Topics   Alcohol use: Yes    Comment: occasionally   Drug use: Not Currently    Frequency: 2.0 times per week    Types: Marijuana     Allergies   Patient has no known allergies.   Review of Systems Review of Systems  Constitutional:  Negative for fever.  Musculoskeletal:  Positive for arthralgias, gait problem, joint swelling and myalgias.  Skin:  Positive for color  change and wound.  All other systems reviewed and are negative.    Physical Exam Triage Vital Signs ED Triage Vitals  Encounter Vitals Group     BP      Girls Systolic BP Percentile      Girls Diastolic BP Percentile      Boys Systolic BP Percentile      Boys Diastolic BP Percentile      Pulse      Resp      Temp      Temp src      SpO2      Weight      Height      Head Circumference      Peak Flow      Pain Score      Pain Loc      Pain Education      Exclude from Growth Chart    No data found.  Updated Vital Signs BP 102/63 (BP Location: Right Arm)   Pulse 72   Temp 98.2 F (36.8 C) (Oral)   Resp 17   LMP 02/02/2024  (Approximate)   SpO2 96%   Visual Acuity Right Eye Distance:   Left Eye Distance:   Bilateral Distance:    Right Eye Near:   Left Eye Near:    Bilateral Near:     Physical Exam Vitals and nursing note reviewed.   Cardiovascular:     Pulses:          Dorsalis pedis pulses are 2+ on the right side.   Musculoskeletal:     Right ankle: Swelling and ecchymosis present. No deformity. Tenderness present over the medial malleolus. Normal pulse.       Legs:   Neurological:     General: No focal deficit present.     Mental Status: She is alert and oriented to person, place, and time.     GCS: GCS eye subscore is 4. GCS verbal subscore is 5. GCS motor subscore is 6.     Sensory: Sensation is intact.     Motor: Motor function is intact.      UC Treatments / Results  Labs (all labs ordered are listed, but only abnormal results are displayed) Labs Reviewed - No data to display  EKG   Radiology DG Ankle Complete Right Result Date: 02/09/2024 CLINICAL DATA:  Ankle injury. Patient reports a 50 lb box falling on foot. EXAM: RIGHT ANKLE - COMPLETE 3+ VIEW COMPARISON:  None Available. FINDINGS: The mineralization and alignment are normal. There is no evidence of acute fracture or dislocation. The joint spaces appear preserved. Probable soft tissue swelling medially in the distal lower leg without evidence of foreign body or soft tissue emphysema. IMPRESSION: No evidence of acute fracture or dislocation. Probable soft tissue swelling medially. Electronically Signed   By: Elsie Perone M.D.   On: 02/09/2024 19:08    Procedures Procedures (including critical care time)  Medications Ordered in UC Medications - No data to display  Initial Impression / Assessment and Plan / UC Course  I have reviewed the triage vital signs and the nursing notes.  Pertinent labs & imaging results that were available during my care of the patient were reviewed by me and considered in my medical decision  making (see chart for details).    Discussed exam findings and plan of care with patient, negative x-ray result, referred to orthopedics -call for appointment , Ace wrap applied by staff, patient remains neurovascularly intact pre and post  Ace wrap application, patient given crutches with instructions, recommend RICE, follow-up with orthopedics/Worker's Comp..  May take Tylenol  ibuprofen  as label directed, strict go to ER precautions given, patient verbalized understanding to this provider.  Ddx: Right ankle injury, contusion, fracture, dislocation, sprain, strain Final Clinical Impressions(s) / UC Diagnoses   Final diagnoses:  Contusion of right ankle, initial encounter  Injury of right ankle, initial encounter     Discharge Instructions      Your x-ray was negative , no fracture, no dislocation Wear ace wrap,use crutches,Tip toe weight bearing right ankle until cleared by Orthopedics May take tylenol ,ibuprofen  as label directed for pain Ice and elevate when able 15-20 min 3 x daily Follow up with your work regarding workman's comp/orthopedic follow up-call for appt     ED Prescriptions   None    PDMP not reviewed this encounter.   Aminta Loose, NP 02/09/24 [phone]

## 2024-02-24 ENCOUNTER — Ambulatory Visit
Admission: RE | Admit: 2024-02-24 | Discharge: 2024-02-24 | Disposition: A | Discharge status: Home / Self Care | Payer: Worker's Compensation | Source: Ambulatory Visit | Attending: Family Medicine | Admitting: Family Medicine

## 2024-02-24 ENCOUNTER — Other Ambulatory Visit: Payer: Self-pay | Admitting: Family Medicine

## 2024-02-24 DIAGNOSIS — M25571 Pain in right ankle and joints of right foot: Secondary | ICD-10-CM

## 2024-07-29 ENCOUNTER — Other Ambulatory Visit: Payer: Self-pay

## 2024-07-29 ENCOUNTER — Ambulatory Visit: Admission: RE | Admit: 2024-07-29 | Discharge: 2024-07-29 | Disposition: A | Discharge status: Home / Self Care

## 2024-07-29 VITALS — BP 103/66 | HR 93 | Temp 98.4°F | Resp 18 | Ht 62.0 in | Wt 148.0 lb

## 2024-07-29 DIAGNOSIS — R0981 Nasal congestion: Secondary | ICD-10-CM

## 2024-07-29 DIAGNOSIS — J069 Acute upper respiratory infection, unspecified: Secondary | ICD-10-CM

## 2024-07-29 LAB — POC COVID19/FLU A&B COMBO
Covid Antigen, POC: NEGATIVE
Influenza A Antigen, POC: NEGATIVE
Influenza B Antigen, POC: NEGATIVE

## 2024-07-29 MED ORDER — PSEUDOEPHEDRINE HCL 30 MG PO TABS: 30.0000 mg | ORAL_TABLET | ORAL | 0 refills | Status: AC | PRN

## 2024-07-29 NOTE — Discharge Instructions (Addendum)
 Take Sudafed as needed for nasal congestion Ensure you are drinking at least 64 ounces of water daily to help loosen secretions For any body aches or pain you can alternate between 600 mg of ibuprofen  and 500 mg of Tylenol  every 4-6 hours COVID and flu testing were negative, you most likely have a different viral illness  Viral illnesses typically last between 5 and 7 days.  For any prolonged symptoms or new concerning symptoms seek follow-up care

## 2024-07-29 NOTE — ED Triage Notes (Signed)
 Pt presents with c/o nasal congestion, sore throat, headaches, and nausea. Symptoms began yesterday. Currently rates overall pain a 6/10. OTC Tylenol  Cold & Flu taken for symptoms reported with improvement/relief. Also has been using throat lozenges. Unsure of fevers. Does endorse sick contacts at work.

## 2024-07-29 NOTE — ED Provider Notes (Signed)
 [facility]    CSN: [id number] Arrival date & time: 07/29/24  1743      History   Chief Complaint Chief Complaint  Patient presents with   Nasal Congestion    HPI Floyce Bujak is a 28 y.o. adult.   Patient presents to clinic over concern of nasal congestion, sore throat, headache and nausea that started yesterday.  Has not had any fevers.  Denies wheezing or shortness of breath.  Has not had vomiting or diarrhea.  Did take Tylenol  Cold and flu yesterday and today with some improvement.  Using over-the-counter throat lozenges. Recent sick contacts at work.  The history is provided by the patient and medical records.    Past Medical History:  Diagnosis Date   At risk for intentional self-harm 05/04/2020   Influenza A 07/19/2021    Patient Active Problem List   Diagnosis Date Noted   Injury of right ankle 02/09/2024   Suicidal ideation 01/22/2022   Major depressive disorder, recurrent episode with anxious distress 06/07/2020   Recurrent moderate major depressive disorder with anxiety (HCC) 05/15/2020   PTSD (post-traumatic stress disorder) 05/15/2020   Migraine headache 10/13/2012    Past Surgical History:  Procedure Laterality Date   NO PAST SURGERIES      OB History   No obstetric history on file.      Home Medications    Prior to Admission medications  Medication Sig Start Date End Date Taking? Authorizing Provider  albuterol (VENTOLIN HFA) 108 (90 Base) MCG/ACT inhaler Inhale 2 puffs into the lungs. 01/22/23  Yes [provider]  propranolol  ER (INDERAL  LA) 60 MG 24 hr capsule Take 60 mg by mouth daily. 03/04/24 03/04/25 Yes [provider]  pseudoephedrine  (SUDAFED) 30 MG tablet Take 1 tablet (30 mg total) by mouth every 4 (four) hours as needed for congestion. 07/29/24  Yes Myrakle Wingler  N, FNP  cetirizine  (ZYRTEC ) 10 MG tablet Take 1 tablet (10 mg total) by mouth daily. 03/12/22   Brien Belvie BRAVO, MD  EMGALITY 120  MG/ML SOAJ Inject 1 mL into the skin every 30 (thirty) days.    [provider]  FLUoxetine  (PROZAC ) 10 MG capsule Take 1 capsule (10 mg total) by mouth daily. 06/13/22 06/13/23  Arfeen, Leni DASEN, MD  fluticasone  (FLONASE ) 50 MCG/ACT nasal spray Place 2 sprays into both nostrils daily. Patient taking differently: Place 2 sprays into both nostrils daily as needed for allergies. 09/19/21   Mayers, Cari S, PA-C  hydrOXYzine  (VISTARIL ) 25 MG capsule Take 1 capsule (25 mg total) by mouth at bedtime as needed. 06/13/22   Arfeen, Leni DASEN, MD  ibuprofen  (ADVIL ) 800 MG tablet Take 800 mg by mouth every 8 (eight) hours as needed.    [provider]  metoprolol  succinate (TOPROL -XL) 25 MG 24 hr tablet Take 1 tablet (25 mg total) by mouth daily. 03/17/22   Brien Belvie BRAVO, MD  oseltamivir  (TAMIFLU ) 75 MG capsule Take 1 capsule (75 mg total) by mouth 2 (two) times daily. 05/14/22   Gladis Elsie BROCKS, PA-C  escitalopram  (LEXAPRO ) 20 MG tablet Take 1 tablet (20 mg total) by mouth daily. 07/17/20 09/28/20  Vicci Barnie NOVAK, MD    Family History Family History  Problem Relation Age of Onset   Diabetes Maternal Grandmother    Diabetes Maternal Grandfather     Social History Social History[1]   Allergies   Patient has no known allergies.   Review of Systems Review of Systems  Per HPI  Physical  Exam Triage Vital Signs ED Triage Vitals  Encounter Vitals Group     BP 07/29/24 1759 103/66     Girls Systolic BP Percentile --      Girls Diastolic BP Percentile --      Boys Systolic BP Percentile --      Boys Diastolic BP Percentile --      Pulse Rate 07/29/24 1759 93     Resp 07/29/24 1759 18     Temp 07/29/24 1759 98.4 F (36.9 C)     Temp Source 07/29/24 1759 Oral     SpO2 07/29/24 1759 97 %     Weight 07/29/24 1756 148 lb (67.1 kg)     Height 07/29/24 1756 5' 2 (1.575 m)     Head Circumference --      Peak Flow --      Pain Score 07/29/24 1756 6     Pain Loc --      Pain  Education --      Exclude from Growth Chart --    No data found.  Updated Vital Signs BP 103/66 (BP Location: Right Arm)   Pulse 93   Temp 98.4 F (36.9 C) (Oral)   Resp 18   Ht 5' 2 (1.575 m)   Wt 148 lb (67.1 kg)   SpO2 97%   BMI 27.07 kg/m   Visual Acuity Right Eye Distance:   Left Eye Distance:   Bilateral Distance:    Right Eye Near:   Left Eye Near:    Bilateral Near:     Physical Exam Vitals and nursing note reviewed.  Constitutional:      General: She is not in acute distress.    Appearance: Normal appearance. She is well-developed.  HENT:     Head: Normocephalic and atraumatic.     Right Ear: External ear normal.     Left Ear: External ear normal.     Nose: Congestion present.     Mouth/Throat:     Mouth: Mucous membranes are moist.     Pharynx: Posterior oropharyngeal erythema present.  Eyes:     Conjunctiva/sclera: Conjunctivae normal.  Cardiovascular:     Rate and Rhythm: Normal rate and regular rhythm.     Heart sounds: Normal heart sounds. No murmur heard. Pulmonary:     Effort: Pulmonary effort is normal. No respiratory distress.     Breath sounds: Normal breath sounds.  Abdominal:     Palpations: Abdomen is soft.  Skin:    General: Skin is warm and dry.     Capillary Refill: Capillary refill takes less than 2 seconds.  Neurological:     General: No focal deficit present.     Mental Status: She is alert.  Psychiatric:        Mood and Affect: Mood normal.      UC Treatments / Results  Labs (all labs ordered are listed, but only abnormal results are displayed) Labs Reviewed  POC COVID19/FLU A&B COMBO    EKG   Radiology No results found.  Procedures Procedures (including critical care time)  Medications Ordered in UC Medications - No data to display  Initial Impression / Assessment and Plan / UC Course  I have reviewed the triage vital signs and the nursing notes.  Pertinent labs & imaging results that were available  during my care of the patient were reviewed by me and considered in my medical decision making (see chart for details).  Vitals and triage reviewed, patient is hemodynamically  stable.  Lungs vesicular, heart with regular rate and rhythm.  Congestion, posterior pharynx erythema and postnasal drip present.  Uvula midline and tonsils without exudate.  COVID and flu testing negative, suspect other viral URI.  Symptomatic management discussed.  Plan of care, follow-up care and return precautions given, no questions at this time.  Work note provided.    Final Clinical Impressions(s) / UC Diagnoses   Final diagnoses:  Nasal congestion  Viral URI     Discharge Instructions      Take Sudafed as needed for nasal congestion Ensure you are drinking at least 64 ounces of water daily to help loosen secretions For any body aches or pain you can alternate between 600 mg of ibuprofen  and 500 mg of Tylenol  every 4-6 hours COVID and flu testing were negative, you most likely have a different viral illness  Viral illnesses typically last between 5 and 7 days.  For any prolonged symptoms or new concerning symptoms seek follow-up care      ED Prescriptions     Medication Sig Dispense Auth. Provider   pseudoephedrine  (SUDAFED) 30 MG tablet Take 1 tablet (30 mg total) by mouth every 4 (four) hours as needed for congestion. 30 tablet Dreama, Prestyn Stanco  N, FNP      PDMP not reviewed this encounter.    [1]  Social History Tobacco Use   Smoking status: Never   Smokeless tobacco: Current  Vaping Use   Vaping status: Never Used  Substance Use Topics   Alcohol use: Yes    Comment: occasionally   Drug use: Not Currently    Frequency: 2.0 times per week    Types: Marijuana     Dreama Augustin SAILOR, FNP 07/29/24 1828
